# Patient Record
Sex: Male | Born: 1975 | Race: Black or African American | Hispanic: No | Marital: Married | Smoking: Current some day smoker
Health system: Southern US, Community
[De-identification: ages and names within clinical notes are randomized; demographics above are authoritative.]

## PROBLEM LIST (undated history)

## (undated) DIAGNOSIS — E669 Obesity, unspecified: Secondary | ICD-10-CM

## (undated) DIAGNOSIS — I10 Essential (primary) hypertension: Secondary | ICD-10-CM

## (undated) DIAGNOSIS — R002 Palpitations: Secondary | ICD-10-CM

## (undated) DIAGNOSIS — Z9884 Bariatric surgery status: Secondary | ICD-10-CM

## (undated) DIAGNOSIS — I4729 Other ventricular tachycardia: Secondary | ICD-10-CM

## (undated) DIAGNOSIS — E119 Type 2 diabetes mellitus without complications: Secondary | ICD-10-CM

## (undated) DIAGNOSIS — I472 Ventricular tachycardia: Secondary | ICD-10-CM

## (undated) HISTORY — DX: Type 2 diabetes mellitus without complications: E11.9

## (undated) HISTORY — DX: Essential (primary) hypertension: I10

## (undated) HISTORY — DX: Bariatric surgery status: Z98.84

## (undated) HISTORY — DX: Palpitations: R00.2

## (undated) HISTORY — DX: Other ventricular tachycardia: I47.29

## (undated) HISTORY — DX: Ventricular tachycardia: I47.2

---

## 2017-04-16 ENCOUNTER — Emergency Department (HOSPITAL_COMMUNITY)
Admission: EM | Admit: 2017-04-16 | Discharge: 2017-04-17 | Payer: Self-pay | Attending: Emergency Medicine | Admitting: Emergency Medicine

## 2017-04-16 ENCOUNTER — Encounter (HOSPITAL_COMMUNITY): Payer: Self-pay | Admitting: Emergency Medicine

## 2017-04-16 DIAGNOSIS — R000 Tachycardia, unspecified: Secondary | ICD-10-CM | POA: Insufficient documentation

## 2017-04-16 DIAGNOSIS — F29 Unspecified psychosis not due to a substance or known physiological condition: Secondary | ICD-10-CM | POA: Insufficient documentation

## 2017-04-16 LAB — COMPREHENSIVE METABOLIC PANEL
ALT: 54 U/L (ref 17–63)
AST: 37 U/L (ref 15–41)
Albumin: 4.3 g/dL (ref 3.5–5.0)
Alkaline Phosphatase: 69 U/L (ref 38–126)
Anion gap: 13 (ref 5–15)
BUN: 9 mg/dL (ref 6–20)
CO2: 22 mmol/L (ref 22–32)
Calcium: 9.5 mg/dL (ref 8.9–10.3)
Chloride: 102 mmol/L (ref 101–111)
Creatinine, Ser: 1.16 mg/dL (ref 0.61–1.24)
GFR calc Af Amer: >60 mL/min (ref >60)
GFR calc non Af Amer: >60 mL/min (ref >60)
Glucose, Bld: 193 mg/dL — ABNORMAL HIGH (ref 65–99)
Potassium: 3.5 mmol/L (ref 3.5–5.1)
Sodium: 137 mmol/L (ref 135–145)
Total Bilirubin: 0.8 mg/dL (ref 0.3–1.2)
Total Protein: 8.5 g/dL — ABNORMAL HIGH (ref 6.5–8.1)

## 2017-04-16 LAB — CBC
HCT: 45.7 % (ref 39.0–52.0)
Hemoglobin: 15.5 g/dL (ref 13.0–17.0)
MCH: 28.7 pg (ref 26.0–34.0)
MCHC: 33.9 g/dL (ref 30.0–36.0)
MCV: 84.6 fL (ref 78.0–100.0)
Platelets: 294 10*3/uL (ref 150–400)
RBC: 5.4 MIL/uL (ref 4.22–5.81)
RDW: 13.5 % (ref 11.5–15.5)
WBC: 8.7 10*3/uL (ref 4.0–10.5)

## 2017-04-16 LAB — SALICYLATE LEVEL: Salicylate Lvl: <7 mg/dL (ref 2.8–30.0)

## 2017-04-16 LAB — ETHANOL: Alcohol, Ethyl (B): <5 mg/dL (ref <5)

## 2017-04-16 LAB — ACETAMINOPHEN LEVEL: Acetaminophen (Tylenol), Serum: <10 ug/mL — ABNORMAL LOW (ref 10–30)

## 2017-04-16 MED ORDER — STERILE WATER FOR INJECTION IJ SOLN
INTRAMUSCULAR | Status: AC
  Administered 2017-04-16: 10 mL
  Filled 2017-04-16: qty 10

## 2017-04-16 MED ORDER — ZIPRASIDONE MESYLATE 20 MG IM SOLR
20.0000 mg | Freq: Once | INTRAMUSCULAR | Status: AC
  Administered 2017-04-16: 20 mg via INTRAMUSCULAR
  Filled 2017-04-16: qty 20

## 2017-04-16 NOTE — ED Notes (Addendum)
Patient spitting and being really aggressive. MD notified.

## 2017-04-16 NOTE — ED Notes (Signed)
Pt reports "I see and hear them... They're asking, 'Where is my family?'".  Pt will not further elaborate hallucinations, but kept saying, "I'm sorry".  Pt will not tell why he is sorry.

## 2017-04-16 NOTE — ED Notes (Signed)
Patient put in restraints and given Geodon. Patient had to be held by staff to apply restraints.

## 2017-04-16 NOTE — ED Notes (Signed)
Patient refused to let staff to help him. Patient refused vital signs and EKG. Patient yelling, cussing, spitting, hitting walls, and he pulled his IV out. Patient is being really aggressive.

## 2017-04-16 NOTE — ED Triage Notes (Signed)
EMS brought patient in due to patient called stating he is dying. Patient had shrooms early and has been fasting for 5 days. Ems states that patient was tachycardic. Patient is refusing us to do anything for him asking for his wife. Patient wife per EMS did not say anything to them and watch husband being taken away.

## 2017-04-16 NOTE — ED Provider Notes (Signed)
[facility]-EMERGENCY DEPT Provider Note   CSN: [id number] Arrival date & time: 04/16/17  2050   By signing my name below, I, Soijett Blue, attest that this documentation has been prepared under the direction and in the presence of Raeford RazorKohut, Jenniger Figiel, MD. Electronically Signed: Soijett Blue, ED Scribe. 04/16/17. 9:26 PM.  History   Chief Complaint Chief Complaint  Patient presents with  . Tachycardia  . Hallucinations     LEVEL 5 CAVEAT: ALTERED MENTAL STATUS  HPI Bernard Baker is a 41 y.o. male who presents to the Emergency Department BIB EMS complaining of hallucinations onset PTA. Per EMS report, pt called because he thought that he was dying. Pt informed EMS that he had shrooms prior to EMS arrival. EMS reports that they didn't give the pt any medications while en route to the ED. EMS denies the pt having any other symptoms at this time.    The history is provided by the patient and the EMS personnel. No language interpreter was used.    History reviewed. No pertinent past medical history.  There are no active problems to display for this patient.   History reviewed. No pertinent surgical history.  OB History    No data available       Home Medications    Prior to Admission medications   Not on File    Family History History reviewed. No pertinent family history.  Social History Social History  Substance Use Topics  . Smoking status: Never Smoker  . Smokeless tobacco: Never Used  . Alcohol use No     Allergies   Patient has no known allergies.   Review of Systems Review of Systems  Unable to perform ROS: Mental status change     Physical Exam Updated Vital Signs Ht 5\' 7"  (1.702 m)   Wt 250 lb (113.4 kg)   BMI 39.16 kg/m   Physical Exam  Constitutional: He appears well-developed and well-nourished. No distress.  HENT:  Head: Normocephalic and atraumatic.  Eyes: EOM are normal.  Neck: Neck supple.  Cardiovascular: Regular rhythm and normal  heart sounds.  Tachycardia present.  Exam reveals no gallop and no friction rub.   No murmur heard. Pulmonary/Chest: Effort normal and breath sounds normal. No respiratory distress. She has no wheezes. She has no rales.  Abdominal: She exhibits no distension.  Musculoskeletal: Normal range of motion.  Neurological: He is alert.  Moves all extremities. No facial droop. Pupils ~23mm b/l. Not following commands. Muscle tone seems normal.  Skin: Skin is warm and dry.  No external signs of trauma.  Psychiatric:  Pt is acting very bizarrely. Appears to be responding to internal stimuli. Laughing inappropriately. Making hissing noises. Forcefully blinking.   Nursing note and vitals reviewed.    ED Treatments / Results   COORDINATION OF CARE: 9:35 PM Discussed treatment plan at bedside   Labs (all labs ordered are listed, but only abnormal results are displayed) Labs Reviewed  COMPREHENSIVE METABOLIC PANEL - Abnormal; Notable for the following:       Result Value   Glucose, Bld 193 (*)    Total Protein 8.5 (*)    All other components within normal limits  ACETAMINOPHEN LEVEL - Abnormal; Notable for the following:    Acetaminophen (Tylenol), Serum <10 (*)    All other components within normal limits  ETHANOL  SALICYLATE LEVEL  CBC  RAPID URINE DRUG SCREEN, HOSP PERFORMED    EKG  EKG Interpretation None       Radiology No  results found.  Procedures Procedures (including critical care time)  Medications Ordered in ED Medications - No data to display   Initial Impression / Assessment and Plan / ED Course  I have reviewed the triage vital signs and the nursing notes.  Pertinent labs & imaging results that were available during my care of the patient were reviewed by me and considered in my medical decision making (see chart for details).     40yM with bizzare behavior. Most likley drug induced. Difficulty in obtaining needed testing and pulling of monitoring equipment.  Will chemically restrain. Work-up. Supportive care and observation. Needs reassessed in morning. Psychiatric evaluation if continues with this behavior.   Final Clinical Impressions(s) / ED Diagnoses   Final diagnoses:  Psychosis, unspecified psychosis type    New Prescriptions New Prescriptions   No medications on file   I personally preformed the services scribed in my presence. The recorded information has been reviewed is accurate. Raeford Razor, MD.     Raeford Razor, MD 04/27/17 [phone]

## 2017-04-17 LAB — RAPID URINE DRUG SCREEN, HOSP PERFORMED
Amphetamines: NOT DETECTED
Barbiturates: NOT DETECTED
Benzodiazepines: NOT DETECTED
Cocaine: NOT DETECTED
Opiates: NOT DETECTED
Tetrahydrocannabinol: NOT DETECTED

## 2017-04-17 NOTE — ED Notes (Signed)
Patient is discharged waiting on [employer].

## 2017-06-10 ENCOUNTER — Encounter (HOSPITAL_COMMUNITY): Payer: Self-pay | Admitting: *Deleted

## 2017-06-10 ENCOUNTER — Emergency Department (HOSPITAL_COMMUNITY): Payer: Self-pay

## 2017-06-10 ENCOUNTER — Emergency Department (HOSPITAL_COMMUNITY)
Admission: EM | Admit: 2017-06-10 | Discharge: 2017-06-10 | Disposition: A | Discharge status: Home / Self Care | Payer: Self-pay | Attending: Emergency Medicine | Admitting: Emergency Medicine

## 2017-06-10 DIAGNOSIS — I10 Essential (primary) hypertension: Secondary | ICD-10-CM | POA: Insufficient documentation

## 2017-06-10 DIAGNOSIS — R002 Palpitations: Secondary | ICD-10-CM | POA: Insufficient documentation

## 2017-06-10 DIAGNOSIS — R079 Chest pain, unspecified: Secondary | ICD-10-CM | POA: Insufficient documentation

## 2017-06-10 HISTORY — DX: Essential (primary) hypertension: I10

## 2017-06-10 HISTORY — DX: Obesity, unspecified: E66.9

## 2017-06-10 LAB — BASIC METABOLIC PANEL
Anion gap: 14 (ref 5–15)
BUN: 7 mg/dL (ref 6–20)
CALCIUM: 9.8 mg/dL (ref 8.9–10.3)
CO2: 20 mmol/L — ABNORMAL LOW (ref 22–32)
Chloride: 104 mmol/L (ref 101–111)
Creatinine, Ser: 0.95 mg/dL (ref 0.61–1.24)
GFR calc Af Amer: >60 mL/min (ref >60)
GLUCOSE: 124 mg/dL — AB (ref 65–99)
Potassium: 3.5 mmol/L (ref 3.5–5.1)
Sodium: 138 mmol/L (ref 135–145)

## 2017-06-10 LAB — CBC
HEMATOCRIT: 46.1 % (ref 39.0–52.0)
Hemoglobin: 15.2 g/dL (ref 13.0–17.0)
MCH: 28.1 pg (ref 26.0–34.0)
MCHC: 33 g/dL (ref 30.0–36.0)
MCV: 85.2 fL (ref 78.0–100.0)
Platelets: 309 10*3/uL (ref 150–400)
RBC: 5.41 MIL/uL (ref 4.22–5.81)
RDW: 13.3 % (ref 11.5–15.5)
WBC: 5.5 10*3/uL (ref 4.0–10.5)

## 2017-06-10 LAB — I-STAT TROPONIN, ED: TROPONIN I, POC: 0 ng/mL (ref 0.00–0.08)

## 2017-06-10 NOTE — ED Notes (Signed)
Patient Alert and oriented X4. Stable and ambulatory. Patient verbalized understanding of the discharge instructions.  Patient belongings were taken by the patient.  

## 2017-06-10 NOTE — ED Triage Notes (Signed)
Pt reports mid to left side chest pain for several days. Is pain free at this time but started having palpitations last night. ekg done at triage and no acute distress is noted.

## 2017-06-10 NOTE — ED Provider Notes (Signed)
[facility]-EMERGENCY DEPT Provider Note   CSN: [id number] Arrival date & time: 06/10/17  1232     History   Chief Complaint Chief Complaint  Patient presents with  . Chest Pain    HPI Bernard Baker is a 41 y.o. male.  This is a 41 year old male with PMH of HTN who presents with intermittent episodes of palpitations and chest pain which began yesterday evening.  The patient states he was sitting on his couch watching TV when they occurred.  He states he feels like his heart "skips a beat".  He denies any other symptoms and he states he is asymptomatic at this time.  He denies nausea, vomiting, blurry vision, numbness and tingling his extremities, decreased sensation, abdominal pain, dyspnea.  Denies dyspnea occurring when the sensations occur.  He denies exertional dyspnea, syncope.  Denies any history of sudden cardiac death in his family, denies history of cardiac disease less than age 34 in primary relatives.   The history is provided by the patient.    Past Medical History:  Diagnosis Date  . Hypertension   . Obesity     There are no active problems to display for this patient.   History reviewed. No pertinent surgical history.     Home Medications    Prior to Admission medications   Not on File    Family History History reviewed. No pertinent family history.  Social History Social History  Substance Use Topics  . Smoking status: Never Smoker  . Smokeless tobacco: Never Used  . Alcohol use No     Allergies   Shellfish allergy   Review of Systems Review of Systems  Constitutional: Negative for appetite change, chills, diaphoresis, fatigue, fever and unexpected weight change.  HENT: Negative for ear pain and sore throat.   Eyes: Negative for pain and visual disturbance.  Respiratory: Negative for cough, chest tightness, shortness of breath and wheezing.   Cardiovascular: Positive for chest pain. Negative for palpitations and leg swelling.    Gastrointestinal: Negative for abdominal pain, blood in stool and vomiting.  Genitourinary: Negative for dysuria and hematuria.  Musculoskeletal: Positive for back pain. Negative for arthralgias.  Skin: Negative for color change and rash.  Neurological: Negative for dizziness, tremors, seizures, syncope, facial asymmetry, speech difficulty, weakness, light-headedness, numbness and headaches.  All other systems reviewed and are negative.    Physical Exam Updated Vital Signs BP 117/76   Pulse 91   Temp 98.4 F (36.9 C) (Oral)   Resp 20   SpO2 98%   Physical Exam  Constitutional: He is oriented to person, place, and time. He appears well-developed and well-nourished. No distress.  HENT:  Head: Normocephalic and atraumatic.  Eyes: Pupils are equal, round, and reactive to light. Conjunctivae are normal.  Neck: Normal range of motion. Neck supple.  Cardiovascular: Normal rate, regular rhythm, normal heart sounds and intact distal pulses.   No extrasystoles are present.  No murmur heard. No discrepancy noted in bilateral UE pulses or UE and LE pulses.   Pulmonary/Chest: Effort normal and breath sounds normal. No respiratory distress.  Abdominal: Soft. There is no tenderness.  Musculoskeletal: Normal range of motion. He exhibits no edema.  Neurological: He is alert and oriented to person, place, and time. He has normal strength. He displays no tremor. No cranial nerve deficit or sensory deficit. Coordination and gait normal.  Skin: Skin is warm and dry. Capillary refill takes less than 2 seconds. No rash noted. He is not diaphoretic. No erythema. No  pallor.  Psychiatric: He has a normal mood and affect.  Nursing note and vitals reviewed.    ED Treatments / Results  Labs (all labs ordered are listed, but only abnormal results are displayed) Labs Reviewed  BASIC METABOLIC PANEL - Abnormal; Notable for the following:       Result Value   CO2 20 (*)    Glucose, Bld 124 (*)    All  other components within normal limits  CBC  I-STAT TROPONIN, ED    EKG  EKG Interpretation  Date/Time:  Friday June 10 2017 12:35:39 EDT Ventricular Rate:  74 PR Interval:  140 QRS Duration: 82 QT Interval:  368 QTC Calculation: 408 R Axis:   4 Text Interpretation:  Normal sinus rhythm Normal ECG No previous ECGs available Confirmed by Alvira Monday ([id number]) on 06/10/2017 2:57:24 PM       Radiology Dg Chest 2 View  Result Date: 06/10/2017 CLINICAL DATA:  Left-sided chest pain 2 days with abnormal heartbeat and left arm tingling. Slight shortness of breath. EXAM: CHEST  2 VIEW COMPARISON:  None. FINDINGS: The heart size and mediastinal contours are within normal limits. Both lungs are clear. The visualized skeletal structures are unremarkable. IMPRESSION: No active cardiopulmonary disease. Electronically Signed   By: Elberta Fortis M.D.   On: 06/10/2017 13:07    Procedures Procedures (including critical care time)  Medications Ordered in ED Medications - No data to display   Initial Impression / Assessment and Plan / ED Course  I have reviewed the triage vital signs and the nursing notes.  Pertinent labs & imaging results that were available during my care of the patient were reviewed by me and considered in my medical decision making (see chart for details).     This is a 41 year old male with PMH of HTN who presents with intermittent episodes of palpitations and chest pain which began yesterday evening.   ECG is normal sinus rhythm and rate, without evidence of ST or T wave changes of myocardial ischemia.    No EKG findings of HOCM, WPW, Brugada, pre-excitation or prolonged QT. No tachycardia, or right ventricular heart strain suggestive of PE.    Because of the age and risk factors of the patient, ACS will be ruled out with troponin x 1 given lack of chest pain which initially began 24 hours prior. ASA was not given. Chest XR and EKG performed along with CBC and  BMP.  HEART score calculation: 1   Unlikely PNA as CXR shows no acute cardiopulmonary findings, no leukocytosis, no cough, no fever.  Unlikely PE as atypical presentation, patient is PERC negative.  Doubt Aortic Dissection given physical findings as above. Patient has back pain but it is left sided, no active chest pain, chest pain did not occur with back pain, and distal pulses intact with no neurological findings. Patient also is well appearing with a reassuring clinical presentation. Doubt Pancreatitis, Arrhythmia, Pneumothorax, Endo/Myo/Pericarditis, ulcer perforation or other emergent pathology.  Labs and imaging reviewed by myself and considered in medical decision making.  Imaging interpreted by radiology.   At this time, given age and lack of risk factors, I believe chest pain to be benign cause. Patient will be discharged home is follow up with PCP. Patient in agreement with plan.   Return precautions given. All questions answered.  Final Clinical Impressions(s) / ED Diagnoses   Final diagnoses:  Palpitations  Chest pain, unspecified type    New Prescriptions New Prescriptions   No medications on  file     Shaune Pollack, MD 06/10/17 1527    Alvira Monday, MD 06/12/17 1410

## 2017-12-14 ENCOUNTER — Encounter: Payer: Self-pay | Admitting: Cardiology

## 2018-01-10 ENCOUNTER — Encounter: Payer: Self-pay | Admitting: Cardiology

## 2018-01-10 ENCOUNTER — Ambulatory Visit (INDEPENDENT_AMBULATORY_CARE_PROVIDER_SITE_OTHER): Payer: Self-pay | Admitting: Cardiology

## 2018-01-10 ENCOUNTER — Telehealth: Payer: Self-pay | Admitting: *Deleted

## 2018-01-10 VITALS — BP 110/68 | HR 77 | Ht 67.0 in | Wt 259.6 lb

## 2018-01-10 DIAGNOSIS — I10 Essential (primary) hypertension: Secondary | ICD-10-CM | POA: Insufficient documentation

## 2018-01-10 DIAGNOSIS — Z8679 Personal history of other diseases of the circulatory system: Secondary | ICD-10-CM

## 2018-01-10 DIAGNOSIS — E119 Type 2 diabetes mellitus without complications: Secondary | ICD-10-CM

## 2018-01-10 DIAGNOSIS — G4730 Sleep apnea, unspecified: Secondary | ICD-10-CM | POA: Insufficient documentation

## 2018-01-10 DIAGNOSIS — R002 Palpitations: Secondary | ICD-10-CM

## 2018-01-10 DIAGNOSIS — G4719 Other hypersomnia: Secondary | ICD-10-CM

## 2018-01-10 HISTORY — DX: Morbid (severe) obesity due to excess calories: E66.01

## 2018-01-10 HISTORY — DX: Type 2 diabetes mellitus without complications: E11.9

## 2018-01-10 HISTORY — DX: Sleep apnea, unspecified: G47.30

## 2018-01-10 HISTORY — DX: Personal history of other diseases of the circulatory system: Z86.79

## 2018-01-10 NOTE — Patient Instructions (Addendum)
Medication Instructions:  Your physician recommends that you continue on your current medications as directed. Please refer to the Current Medication list given to you today.  Labwork: None   Testing/Procedures: Your physician has requested that you have an echocardiogram. Echocardiography is a painless test that uses sound waves to create images of your heart. It provides your doctor with information about the size and shape of your heart and how well your heart's chambers and valves are working. This procedure takes approximately one hour. There are no restrictions for this procedure. [address]  Your physician has recommended that you wear an 7 DAY event monitor. Event monitors are medical devices that record the heart's electrical activity. Doctors most often us these monitors to diagnose arrhythmias. Arrhythmias are problems with the speed or rhythm of the heartbeat. The monitor is a small, portable device. You can wear one while you do your normal daily activities. This is usually used to diagnose what is causing palpitations/syncope (passing out). [address]  Your physician has recommended that you have a sleep study. This test records several body functions during sleep, including: brain activity, eye movement, oxygen and carbon dioxide blood levels, heart rate and rhythm, breathing rate and rhythm, the flow of air through your mouth and nose, snoring, body muscle movements, and chest and belly movement. YOU WILL RECEIVE A CALL TO SET UP SLEEP STUDY AFTER TEST HAS BEEN APPROVED BY YOUR INSURANCE.  Follow-Up: Your physician recommends that you schedule a follow-up appointment in: AFTER YOUR SLEEP STUDY; SOMEONE FROM OUR OFFICE WILL CALL YOU TO SET UP AN APPOINTMENT   Any Other Special Instructions Will Be Listed Below (If Applicable).  If you need a refill on your cardiac medications before your next appointment, please call your pharmacy.

## 2018-01-10 NOTE — Assessment & Plan Note (Signed)
I could not appreciate a murmur on exam today but with a history of HTN and new onset palpitations will get an echo.

## 2018-01-10 NOTE — Assessment & Plan Note (Signed)
No retinopathy or neuropathy by history

## 2018-01-10 NOTE — Assessment & Plan Note (Signed)
I suspect he is having PVCs-PACs, possibly exacerbated by sleep apnea

## 2018-01-10 NOTE — Progress Notes (Signed)
01/10/2018 [place]   Nov 22, 1975  [id number]  Primary Physician [facility] Primary Cardiologist: Dr SwazilandJordan (new)  HPI:  Pleasant 42 y/o obese AA male, works as a Paediatric nursebarber, with a history of HTN and NIDDM, seen today for evaluation of complaints of nighttime palpitations and being told recently he had a heart murmur. The pt denies tachycardia but says his heart beats "hard and irregular" at night. He has noticed daytime fatigue, not relieved with napping. He has been told he snores and he admits he doesn't sleep well, frequent awakenings. He was seen by his PCP recently and told he had a "heart murmur". He became concerned and set up this appointment for cardiac evaluation. He denies any exertional symptoms, he rides an exercise bike 20 minutes daily with no problem.    Current Outpatient Medications  Medication Sig Dispense Refill  . diclofenac (VOLTAREN) 75 MG EC tablet TK 1 T PO  BID  3  . hydrochlorothiazide (HYDRODIURIL) 25 MG tablet TK 1 T PO  QD  3  . lisinopril (PRINIVIL,ZESTRIL) 20 MG tablet TK 1 T PO  BID  3  . lovastatin (MEVACOR) 10 MG tablet TK 1 T PO  QHS  3  . metFORMIN (GLUCOPHAGE) 1000 MG tablet TK 1 T PO  BID  3   No current facility-administered medications for this visit.     Allergies  Allergen Reactions  . Shellfish Allergy Anaphylaxis    Past Medical History:  Diagnosis Date  . Diabetes mellitus without complication (HCC)   . Hypertension   . Obesity     Social History   Socioeconomic History  . Marital status: Married    Spouse name: Not on file  . Number of children: Not on file  . Years of education: Not on file  . Highest education level: Not on file  Occupational History  . Not on file  Social Needs  . Financial resource strain: Not on file  . Food insecurity:    Worry: Not on file    Inability: Not on file  . Transportation needs:    Medical: Not on file    Non-medical: Not on file  Tobacco Use  . Smoking  status: Former Smoker    Types: Cigarettes    Last attempt to quit: 2015    Years since quitting: 4.2  . Smokeless tobacco: Never Used  Substance and Sexual Activity  . Alcohol use: No  . Drug use: No  . Sexual activity: Not on file  Lifestyle  . Physical activity:    Days per week: Not on file    Minutes per session: Not on file  . Stress: Not on file  Relationships  . Social connections:    Talks on phone: Not on file    Gets together: Not on file    Attends religious service: Not on file    Active member of club or organization: Not on file    Attends meetings of clubs or organizations: Not on file    Relationship status: Not on file  . Intimate partner violence:    Fear of current or ex partner: Not on file    Emotionally abused: Not on file    Physically abused: Not on file    Forced sexual activity: Not on file  Other Topics Concern  . Not on file  Social History Narrative  . Not on file     Family History  Problem Relation Age of Onset  .  Diabetes Mother   . Heart Problems Mother   . Stroke Father   . Heart Problems Father   . Heart Problems Maternal Grandmother   . Diabetes Maternal Grandmother   . Hyperlipidemia Maternal Grandmother   . Hypertension Maternal Grandmother   . Alzheimer's disease Maternal Grandfather      Review of Systems: General: negative for chills, fever, night sweats or weight changes.  Cardiovascular: negative for chest pain, dyspnea on exertion, edema, orthopnea, paroxysmal nocturnal dyspnea or shortness of breath Dermatological: negative for rash Respiratory: negative for cough or wheezing Urologic: negative for hematuria Abdominal: negative for nausea, vomiting, diarrhea, bright red blood per rectum, melena, or hematemesis Neurologic: negative for visual changes, syncope, or dizziness Snoring, daytime fatigue, poor sleep All other systems reviewed and are otherwise negative except as noted above.    Blood pressure 110/68,  pulse 77, height 5\' 7"  (1.702 m), weight 259 lb 9.6 oz (117.8 kg).  General appearance: alert, cooperative, no distress and morbidly obese Neck: no carotid bruit and no JVD Lungs: clear to auscultation bilaterally Heart: regular rate and rhythm and i could not appreciate a heart murmur today Abdomen: obese non tender Extremities: extremities normal, atraumatic, no cyanosis or edema Pulses: 2+ and symmetric Skin: Skin color, texture, turgor normal. No rashes or lesions Neurologic: Grossly normal  EKG NSR  ASSESSMENT AND PLAN:   Palpitations I suspect he is having PVCs-PACs, possibly exacerbated by sleep apnea  Essential hypertension Controlled  Morbid obesity (HCC) BMI 40.6.   Sleep apnea Pt has poor sleep, daytime fatigue, HTN, morbid obesity, and a history of snoring-r/o sleep apnea  Non-insulin dependent type 2 diabetes mellitus (HCC) No retinopathy or neuropathy by history  History of cardiac murmur I could not appreciate a murmur on exam today but with a history of HTN and new onset palpitations will get an echo.    PLAN  Discussed with Dr Swaziland- will obtain an echo, 7 day Monitor, and sleep study. F/U 4 weeks with Dr Swaziland.   Corine Shelter PA-C 01/10/2018 10:25 AM

## 2018-01-10 NOTE — Assessment & Plan Note (Signed)
BMI 40.6 

## 2018-01-10 NOTE — Telephone Encounter (Signed)
-----   Message from Sampson Goon[place]awnee I Trigloff sent at 01/10/2018 10:31 AM EDT ----- Regarding: sleep study   Sleep study

## 2018-01-10 NOTE — Assessment & Plan Note (Signed)
Pt has poor sleep, daytime fatigue, HTN, morbid obesity, and a history of snoring-r/o sleep apnea

## 2018-01-10 NOTE — Assessment & Plan Note (Signed)
Controlled.  

## 2018-01-10 NOTE — Telephone Encounter (Signed)
Patient informed of 01/31/18 sleep study appointment.

## 2018-01-18 ENCOUNTER — Ambulatory Visit (HOSPITAL_COMMUNITY): Payer: Self-pay | Attending: Cardiovascular Disease

## 2018-01-18 DIAGNOSIS — R0989 Other specified symptoms and signs involving the circulatory and respiratory systems: Secondary | ICD-10-CM

## 2018-01-30 ENCOUNTER — Ambulatory Visit (INDEPENDENT_AMBULATORY_CARE_PROVIDER_SITE_OTHER): Payer: Self-pay

## 2018-01-30 DIAGNOSIS — I10 Essential (primary) hypertension: Secondary | ICD-10-CM

## 2018-01-30 DIAGNOSIS — Z8679 Personal history of other diseases of the circulatory system: Secondary | ICD-10-CM

## 2018-01-30 DIAGNOSIS — E119 Type 2 diabetes mellitus without complications: Secondary | ICD-10-CM

## 2018-01-30 DIAGNOSIS — R002 Palpitations: Secondary | ICD-10-CM

## 2018-01-30 DIAGNOSIS — G4730 Sleep apnea, unspecified: Secondary | ICD-10-CM

## 2018-01-31 ENCOUNTER — Ambulatory Visit (HOSPITAL_BASED_OUTPATIENT_CLINIC_OR_DEPARTMENT_OTHER): Payer: Self-pay

## 2018-02-07 ENCOUNTER — Ambulatory Visit (HOSPITAL_COMMUNITY): Payer: Self-pay | Attending: Internal Medicine

## 2018-02-07 ENCOUNTER — Other Ambulatory Visit: Payer: Self-pay

## 2018-02-07 DIAGNOSIS — E669 Obesity, unspecified: Secondary | ICD-10-CM | POA: Insufficient documentation

## 2018-02-07 DIAGNOSIS — Z6841 Body Mass Index (BMI) 40.0 and over, adult: Secondary | ICD-10-CM | POA: Insufficient documentation

## 2018-02-07 DIAGNOSIS — I119 Hypertensive heart disease without heart failure: Secondary | ICD-10-CM | POA: Insufficient documentation

## 2018-02-07 DIAGNOSIS — E119 Type 2 diabetes mellitus without complications: Secondary | ICD-10-CM | POA: Insufficient documentation

## 2018-02-07 DIAGNOSIS — R002 Palpitations: Secondary | ICD-10-CM | POA: Insufficient documentation

## 2018-02-07 DIAGNOSIS — Z8679 Personal history of other diseases of the circulatory system: Secondary | ICD-10-CM

## 2018-02-07 DIAGNOSIS — G4730 Sleep apnea, unspecified: Secondary | ICD-10-CM

## 2018-02-07 DIAGNOSIS — I10 Essential (primary) hypertension: Secondary | ICD-10-CM

## 2018-02-21 ENCOUNTER — Ambulatory Visit (HOSPITAL_BASED_OUTPATIENT_CLINIC_OR_DEPARTMENT_OTHER): Payer: Self-pay | Attending: Cardiology | Admitting: Cardiovascular Disease

## 2018-02-21 VITALS — Ht 67.0 in | Wt 259.0 lb

## 2018-02-21 DIAGNOSIS — R002 Palpitations: Secondary | ICD-10-CM | POA: Insufficient documentation

## 2018-02-21 DIAGNOSIS — Z6841 Body Mass Index (BMI) 40.0 and over, adult: Secondary | ICD-10-CM | POA: Insufficient documentation

## 2018-02-21 DIAGNOSIS — G4761 Periodic limb movement disorder: Secondary | ICD-10-CM | POA: Insufficient documentation

## 2018-02-21 DIAGNOSIS — I10 Essential (primary) hypertension: Secondary | ICD-10-CM | POA: Insufficient documentation

## 2018-02-21 DIAGNOSIS — G4719 Other hypersomnia: Secondary | ICD-10-CM | POA: Insufficient documentation

## 2018-02-21 DIAGNOSIS — R0683 Snoring: Secondary | ICD-10-CM

## 2018-02-21 DIAGNOSIS — E119 Type 2 diabetes mellitus without complications: Secondary | ICD-10-CM | POA: Insufficient documentation

## 2018-02-21 DIAGNOSIS — G4736 Sleep related hypoventilation in conditions classified elsewhere: Secondary | ICD-10-CM | POA: Insufficient documentation

## 2018-02-25 ENCOUNTER — Encounter (HOSPITAL_BASED_OUTPATIENT_CLINIC_OR_DEPARTMENT_OTHER): Payer: Self-pay | Admitting: Cardiovascular Disease

## 2018-02-25 NOTE — Procedures (Signed)
Patient Name: Bernard Baker, [name] Study Date: 02/21/2018 Gender: Male D.O.B: Sep 11, 1976 Age (years): 41 Referring Provider: Corine ShelterLuke Kilroy Height (inches): 67 Interpreting Physician: Nicki Guadalajarahomas Jaclin Finks MD, ABSM Weight (lbs): 255 RPSGT: Lowry RamMckinney, Takeya BMI: 40 MRN: [id number] Neck Size: 17.00  CLINICAL INFORMATION Sleep Study Type: NPSG  Indication for sleep study: Diabetes, Excessive Daytime Sleepiness, Hypertension, Obesity, OSA, Snoring  Epworth Sleepiness Score: 17  SLEEP STUDY TECHNIQUE As per the AASM Manual for the Scoring of Sleep and Associated Events v2.3 (April 2016) with a hypopnea requiring 4% desaturations.  The channels recorded and monitored were frontal, central and occipital EEG, electrooculogram (EOG), submentalis EMG (chin), nasal and oral airflow, thoracic and abdominal wall motion, anterior tibialis EMG, snore microphone, electrocardiogram, and pulse oximetry.  MEDICATIONS     diclofenac (VOLTAREN) 75 MG EC tablet             hydrochlorothiazide (HYDRODIURIL) 25 MG tablet         lisinopril (PRINIVIL,ZESTRIL) 20 MG tablet         lovastatin (MEVACOR) 10 MG tablet         metFORMIN (GLUCOPHAGE) 1000 MG tablet      Medications self-administered by patient taken the night of the study : LOVASTATIN  SLEEP ARCHITECTURE The study was initiated at 9:50:13 PM and ended at 3:59:31 AM.  Sleep onset time was 13.8 minutes and the sleep efficiency was 86.4%%. The total sleep time was 319 minutes.  Stage REM latency was 97.0 minutes.  The patient spent 4.4%% of the night in stage N1 sleep, 80.6%% in stage N2 sleep, 1.9%% in stage N3 and 13.17% in REM.  Alpha intrusion was absent.  Supine sleep was 30.08%.  RESPIRATORY PARAMETERS The overall apnea/hypopnea index (AHI) was 0.4 per hour. The respiratory disturbance index (RDI) was 0.9 per hour. There were 1 total apneas, including 0 obstructive, 1 central and 0 mixed apneas. There were 1 hypopneas and 3  RERAs.  The AHI during Stage REM sleep was 2.9 per hour.  AHI while supine was 0.0 per hour.  The mean oxygen saturation was 93.2%. The minimum SpO2 during sleep was 88.0%.  moderate snoring was noted during this study.  CARDIAC DATA The 2 lead EKG demonstrated sinus rhythm. The mean heart rate was 68.8 beats per minute. Other EKG findings include: None.  LEG MOVEMENT DATA The total PLMS were 61 with a resulting PLMS index of 11.1. Associated arousal with leg movement index was 5.3 .  IMPRESSIONS - No significant obstructive sleep apnea occurred during this study (AHI 0.4/h; RDI 0.9/h; REM AHI 2.9/h). - No significant central sleep apnea occurred during this study (CAI = 0.2/h). - Minimal oxygen desaturation to a nadir of 88%. - The patient snored with moderate snoring volume. - No cardiac abnormalities were noted during this study. - Clinically significant periodic limb movements did not occur during sleep. Associated arousals were significant.  DIAGNOSIS - Periodic Limb Movement During Sleep (327.51 [G47.61 ICD-10]) - Excesive Daytime Sleepiness - Nocturnal Hypoxemia (327.26 [G47.36 ICD-10])  RECOMMENDATIONS - At present there is no indication for CPAP therapy.  - Effort should be made to optimize nasal and oropharyngeal patency. Consider alternatives for the treatment of moderate snoring.  - If patient conitues to have excessive daytime sleepiness with an ESS score of 17, consider scheduling a MLST (Multiple latency sleep test) to evaluate for idiopathic hypersomnolence or narcolepsy. - If patient is symptomatic with restless legs, consider a trial of pharmocotherapy.  - Avoid alcohol, sedatives and other  CNS depressants that may worsen sleep apnea and disrupt normal sleep architecture. - Sleep hygiene should be reviewed to assess factors that may improve sleep quality and duration. - Weight management (BMI 40) and regular exercise should be initiated.  [Electronically  signed] 02/25/2018 04:15 PM  Nicki Guadalajara MD, [facility], ABSM Diplomate, American Board of Sleep Medicine   NPI: [id number] [facility] PH: [phone]   FX: [phone] ACCREDITED BY THE AMERICAN ACADEMY OF SLEEP MEDICINE

## 2018-02-28 ENCOUNTER — Telehealth: Payer: Self-pay | Admitting: *Deleted

## 2018-02-28 NOTE — Telephone Encounter (Signed)
-----   Message from Lennette Biharihomas A Kelly, MD sent at 02/25/2018  4:22 PM EDT ----- Bernard MortimerWanda please notify pt the resuots of study.  With significant ESS of 17 consider scheduling pt for an MLST if she continues to have daytime sleepiness

## 2018-02-28 NOTE — Progress Notes (Signed)
Patient notified of results.

## 2018-02-28 NOTE — Telephone Encounter (Signed)
Patient informed of sleep study results and recommendations. He wants to defer the MLST for now.

## 2018-04-07 ENCOUNTER — Encounter: Payer: Self-pay | Admitting: Cardiology

## 2020-06-23 HISTORY — PX: LAPAROSCOPIC GASTRIC SLEEVE RESECTION: SHX5895

## 2020-08-11 ENCOUNTER — Emergency Department (HOSPITAL_COMMUNITY)
Admission: EM | Admit: 2020-08-11 | Discharge: 2020-08-11 | Disposition: A | Discharge status: Home / Self Care | Payer: Self-pay | Attending: Emergency Medicine | Admitting: Emergency Medicine

## 2020-08-11 ENCOUNTER — Encounter (HOSPITAL_COMMUNITY): Payer: Self-pay | Admitting: Emergency Medicine

## 2020-08-11 DIAGNOSIS — Z79899 Other long term (current) drug therapy: Secondary | ICD-10-CM | POA: Insufficient documentation

## 2020-08-11 DIAGNOSIS — Z87891 Personal history of nicotine dependence: Secondary | ICD-10-CM | POA: Insufficient documentation

## 2020-08-11 DIAGNOSIS — M549 Dorsalgia, unspecified: Secondary | ICD-10-CM

## 2020-08-11 DIAGNOSIS — I10 Essential (primary) hypertension: Secondary | ICD-10-CM | POA: Insufficient documentation

## 2020-08-11 DIAGNOSIS — E119 Type 2 diabetes mellitus without complications: Secondary | ICD-10-CM | POA: Insufficient documentation

## 2020-08-11 DIAGNOSIS — Z7984 Long term (current) use of oral hypoglycemic drugs: Secondary | ICD-10-CM | POA: Insufficient documentation

## 2020-08-11 DIAGNOSIS — M545 Low back pain, unspecified: Secondary | ICD-10-CM | POA: Insufficient documentation

## 2020-08-11 LAB — BASIC METABOLIC PANEL
Anion gap: 11 (ref 5–15)
BUN: 5 mg/dL — ABNORMAL LOW (ref 6–20)
CO2: 24 mmol/L (ref 22–32)
Calcium: 9.1 mg/dL (ref 8.9–10.3)
Chloride: 105 mmol/L (ref 98–111)
Creatinine, Ser: 0.9 mg/dL (ref 0.61–1.24)
GFR, Estimated: >60 mL/min (ref >60)
Glucose, Bld: 71 mg/dL (ref 70–99)
Potassium: 3.9 mmol/L (ref 3.5–5.1)
Sodium: 140 mmol/L (ref 135–145)

## 2020-08-11 LAB — URINALYSIS, ROUTINE W REFLEX MICROSCOPIC
Glucose, UA: NEGATIVE mg/dL
Hgb urine dipstick: NEGATIVE
Ketones, ur: 80 mg/dL — AB
Leukocytes,Ua: NEGATIVE
Nitrite: NEGATIVE
Protein, ur: 100 mg/dL — AB
Specific Gravity, Urine: 1.03 (ref 1.005–1.030)
pH: 5 (ref 5.0–8.0)

## 2020-08-11 LAB — CBC
HCT: 45.2 % (ref 39.0–52.0)
Hemoglobin: 14 g/dL (ref 13.0–17.0)
MCH: 27.8 pg (ref 26.0–34.0)
MCHC: 31 g/dL (ref 30.0–36.0)
MCV: 89.9 fL (ref 80.0–100.0)
Platelets: 260 10*3/uL (ref 150–400)
RBC: 5.03 MIL/uL (ref 4.22–5.81)
RDW: 14 % (ref 11.5–15.5)
WBC: 4.3 10*3/uL (ref 4.0–10.5)
nRBC: 0 % (ref 0.0–0.2)

## 2020-08-11 MED ORDER — LIDOCAINE 5 % EX PTCH: 1.0000 | MEDICATED_PATCH | CUTANEOUS | 0 refills | Status: DC

## 2020-08-11 MED ORDER — CYCLOBENZAPRINE HCL 10 MG PO TABS: 10.0000 mg | ORAL_TABLET | Freq: Two times a day (BID) | ORAL | 0 refills | Status: DC | PRN

## 2020-08-11 NOTE — Discharge Instructions (Addendum)
Continue to take Tylenol ibuprofen for the back pain.  Try adding on the Flexeril and the Lidoderm patch.  The Lidoderm may be expensive depending on your insurance plan.  If so, you can use over-the-counter lidocaine patches as an alternative.  Brand names include salonpas.  Follow up with your primary care doctor of chiropractor for further treatment.

## 2020-08-11 NOTE — ED Provider Notes (Signed)
[facility] EMERGENCY DEPARTMENT Provider Note   CSN: [id number] Arrival date & time: 08/11/20  0740     History Chief Complaint  Patient presents with   Back Pain    Bernard Baker is a 44 y.o. male.  HPI   Patient presents ED for evaluation of back pain.  Patient states the symptoms started several months ago.  Pain is in his left side of his lower back.  Originally when the pain was starting patient had been seeing a [facility].  Patient also ended up getting gastric bypass surgery.  He was out of work for period of time while recovering from the gastric bypass surgery.  His symptoms got better.  Patient started working again and since then he feels like the pain has been increasing.  He stands a lot during the day.  He works as a Paediatric nurse.  He denies any numbness or weakness.  He denies any trouble with abdominal pain.  No vomiting.  He feels like he is urinating normally.  Patient did have an MRI in the [place].  He states his chiropractor reviewed the MRI but did not see anything acutely abnormal.  Patient is concerned that may be it something more with his kidney because the pain is off towards the left side.  He has been taking medications for pain but the pain persists.  He came to the ED because of his persistent discomfort  Past Medical History:  Diagnosis Date   Diabetes mellitus without complication (HCC)    Hypertension    Obesity     Patient Active Problem List   Diagnosis Date Noted   Palpitations 01/10/2018   History of cardiac murmur 01/10/2018   Essential hypertension 01/10/2018   Non-insulin dependent type 2 diabetes mellitus (HCC) 01/10/2018   Morbid obesity (HCC) 01/10/2018   Sleep apnea 01/10/2018    History reviewed. No pertinent surgical history.     Family History  Problem Relation Age of Onset   Diabetes Mother    Heart Problems Mother    Stroke Father    Heart Problems Father    Heart Problems  Maternal Grandmother    Diabetes Maternal Grandmother    Hyperlipidemia Maternal Grandmother    Hypertension Maternal Grandmother    Alzheimer's disease Maternal Grandfather    Blindness Son    Blindness Daughter    Autism Daughter     Social History   Tobacco Use   Smoking status: Former Smoker    Types: Cigarettes    Quit date: 2015    Years since quitting: 6.8   Smokeless tobacco: Never Used  Building services engineer Use: Former  Substance Use Topics   Alcohol use: No   Drug use: No    Home Medications Prior to Admission medications   Medication Sig Start Date End Date Taking? Authorizing Provider  cyclobenzaprine (FLEXERIL) 10 MG tablet Take 1 tablet (10 mg total) by mouth 2 (two) times daily as needed for muscle spasms. 08/11/20   Linwood Dibbles, MD  diclofenac (VOLTAREN) 75 MG EC tablet TK 1 T PO  BID 12/08/17   [provider]  hydrochlorothiazide (HYDRODIURIL) 25 MG tablet TK 1 T PO  QD 12/08/17   [provider]  lidocaine (LIDODERM) 5 % Place 1 patch onto the skin daily. Remove & Discard patch within 12 hours or as directed by MD 08/11/20   Linwood Dibbles, MD  lisinopril (PRINIVIL,ZESTRIL) 20 MG tablet TK 1 T PO  BID 12/08/17  [provider]  lovastatin (MEVACOR) 10 MG tablet TK 1 T PO  QHS 12/26/17   [provider]  metFORMIN (GLUCOPHAGE) 1000 MG tablet TK 1 T PO  BID 12/26/17   [provider]    Allergies    Shellfish allergy  Review of Systems   Review of Systems  All other systems reviewed and are negative.   Physical Exam Updated Vital Signs BP 112/75 (BP Location: Left Arm)    Pulse (!) 56    Temp 98.2 F (36.8 C) (Oral)    Resp 18    SpO2 99%   Physical Exam Vitals and nursing note reviewed.  Constitutional:      General: He is not in acute distress.    Appearance: He is well-developed.  HENT:     Head: Normocephalic and atraumatic.     Right Ear: External ear normal.     Left Ear: External ear normal.   Eyes:     General: No scleral icterus.       Right eye: No discharge.        Left eye: No discharge.     Conjunctiva/sclera: Conjunctivae normal.  Neck:     Trachea: No tracheal deviation.  Cardiovascular:     Rate and Rhythm: Normal rate and regular rhythm.  Pulmonary:     Effort: Pulmonary effort is normal. No respiratory distress.     Breath sounds: Normal breath sounds. No stridor. No wheezing or rales.  Abdominal:     General: Bowel sounds are normal. There is no distension.     Palpations: Abdomen is soft.     Tenderness: There is no abdominal tenderness. There is no guarding or rebound.  Musculoskeletal:        General: No tenderness.     Cervical back: Neck supple.  Skin:    General: Skin is warm and dry.     Findings: No rash.  Neurological:     Mental Status: He is alert.     Cranial Nerves: No cranial nerve deficit (no facial droop, extraocular movements intact, no slurred speech).     Sensory: No sensory deficit.     Motor: No abnormal muscle tone or seizure activity.     Coordination: Coordination normal.     ED Results / Procedures / Treatments   Labs (all labs ordered are listed, but only abnormal results are displayed) Labs Reviewed  BASIC METABOLIC PANEL - Abnormal; Notable for the following components:      Result Value   BUN 5 (*)    All other components within normal limits  URINALYSIS, ROUTINE W REFLEX MICROSCOPIC - Abnormal; Notable for the following components:   APPearance HAZY (*)    Bilirubin Urine SMALL (*)    Ketones, ur 80 (*)    Protein, ur 100 (*)    Bacteria, UA RARE (*)    All other components within normal limits  CBC    Procedures Procedures (including critical care time)  Medications Ordered in ED Medications - No data to display  ED Course  I have reviewed the triage vital signs and the nursing notes.  Pertinent labs & imaging results that were available during my care of the patient were reviewed by me and considered in  my medical decision making (see chart for details).  Clinical Course as of Aug 11 1114  [facility] Aug 11, 2020  1054 Urine does show ketones and protein but no signs of infection or hematuria metabolic panel and CBC  normal   [JK]    Clinical Course User Index [JK] Linwood Dibbles, MD   MDM Rules/Calculators/A&P                         Patient presented with persistent back pain.  Ongoing for several months but recently worsened again after working.  Patient stands a lot while at work.  Physical exam is reassuring.  No acute neurovascular findings.  Patient was concerned about the possibility of renal etiology.  Laboratory tests are normal.  Urinalysis does not show any signs of bladder infection.  I doubt ureteral colic.  Symptoms sound to be more musculoskeletal in nature.  Will discharge home with prescription for Flexeril and lidocaine.  Discussed outpatient continued management by his chiropractor.  Final Clinical Impression(s) / ED Diagnoses Final diagnoses:  Left-sided back pain, unspecified back location, unspecified chronicity    Rx / DC Orders ED Discharge Orders         Ordered    lidocaine (LIDODERM) 5 %  Every 24 hours        08/11/20 1113    cyclobenzaprine (FLEXERIL) 10 MG tablet  2 times daily PRN        08/11/20 1113           Linwood Dibbles, MD 08/11/20 1115

## 2020-08-11 NOTE — ED Triage Notes (Signed)
Patient reports ongoing L back pain for the past few months, states he has had scans done with no explanation for pain. Reports that he had gastric sleeve and was out of work x6 weeks, pain improved then, has since worsened once he started back to work. Denies urinary symptoms or radiation of pain. Ambulatory with nad.

## 2020-08-11 NOTE — ED Notes (Signed)
Pt d/c home per MD order. Discharge summary reviewed with pt, pt verbalizes understanding. Ambulatory off unit. No s/s of acute distress noted at discharge.  °

## 2021-03-06 ENCOUNTER — Other Ambulatory Visit: Payer: Self-pay

## 2021-03-06 ENCOUNTER — Emergency Department (HOSPITAL_COMMUNITY)
Admission: EM | Admit: 2021-03-06 | Discharge: 2021-03-06 | Disposition: A | Discharge status: Home / Self Care | Payer: 59 | Attending: Emergency Medicine | Admitting: Emergency Medicine

## 2021-03-06 ENCOUNTER — Ambulatory Visit: Admission: EM | Admit: 2021-03-06 | Discharge: 2021-03-06 | Discharge status: Against Medical Advice | Payer: Self-pay

## 2021-03-06 ENCOUNTER — Emergency Department (HOSPITAL_COMMUNITY): Payer: 59

## 2021-03-06 DIAGNOSIS — I10 Essential (primary) hypertension: Secondary | ICD-10-CM | POA: Insufficient documentation

## 2021-03-06 DIAGNOSIS — Z87891 Personal history of nicotine dependence: Secondary | ICD-10-CM | POA: Diagnosis not present

## 2021-03-06 DIAGNOSIS — R002 Palpitations: Secondary | ICD-10-CM | POA: Diagnosis not present

## 2021-03-06 DIAGNOSIS — R079 Chest pain, unspecified: Secondary | ICD-10-CM

## 2021-03-06 DIAGNOSIS — Z20822 Contact with and (suspected) exposure to covid-19: Secondary | ICD-10-CM | POA: Diagnosis not present

## 2021-03-06 DIAGNOSIS — Z79899 Other long term (current) drug therapy: Secondary | ICD-10-CM | POA: Diagnosis not present

## 2021-03-06 DIAGNOSIS — R0789 Other chest pain: Secondary | ICD-10-CM | POA: Insufficient documentation

## 2021-03-06 DIAGNOSIS — Z7984 Long term (current) use of oral hypoglycemic drugs: Secondary | ICD-10-CM | POA: Insufficient documentation

## 2021-03-06 DIAGNOSIS — E119 Type 2 diabetes mellitus without complications: Secondary | ICD-10-CM | POA: Insufficient documentation

## 2021-03-06 DIAGNOSIS — R0602 Shortness of breath: Secondary | ICD-10-CM | POA: Diagnosis not present

## 2021-03-06 LAB — CBC WITH DIFFERENTIAL/PLATELET
Abs Immature Granulocytes: 0.02 10*3/uL (ref 0.00–0.07)
Basophils Absolute: 0 10*3/uL (ref 0.0–0.1)
Basophils Relative: 1 %
Eosinophils Absolute: 0.1 10*3/uL (ref 0.0–0.5)
Eosinophils Relative: 3 %
HCT: 49.8 % (ref 39.0–52.0)
Hemoglobin: 16 g/dL (ref 13.0–17.0)
Immature Granulocytes: 0 %
Lymphocytes Relative: 32 %
Lymphs Abs: 1.5 10*3/uL (ref 0.7–4.0)
MCH: 29.6 pg (ref 26.0–34.0)
MCHC: 32.1 g/dL (ref 30.0–36.0)
MCV: 92.2 fL (ref 80.0–100.0)
Monocytes Absolute: 0.4 10*3/uL (ref 0.1–1.0)
Monocytes Relative: 9 %
Neutro Abs: 2.5 10*3/uL (ref 1.7–7.7)
Neutrophils Relative %: 55 %
Platelets: 259 10*3/uL (ref 150–400)
RBC: 5.4 MIL/uL (ref 4.22–5.81)
RDW: 13.5 % (ref 11.5–15.5)
WBC: 4.6 10*3/uL (ref 4.0–10.5)
nRBC: 0 % (ref 0.0–0.2)

## 2021-03-06 LAB — BASIC METABOLIC PANEL
Anion gap: 10 (ref 5–15)
BUN: 9 mg/dL (ref 6–20)
CO2: 24 mmol/L (ref 22–32)
Calcium: 9.6 mg/dL (ref 8.9–10.3)
Chloride: 105 mmol/L (ref 98–111)
Creatinine, Ser: 1.01 mg/dL (ref 0.61–1.24)
GFR, Estimated: >60 mL/min (ref >60)
Glucose, Bld: 75 mg/dL (ref 70–99)
Potassium: 4.6 mmol/L (ref 3.5–5.1)
Sodium: 139 mmol/L (ref 135–145)

## 2021-03-06 LAB — SARS CORONAVIRUS 2 (TAT 6-24 HRS): SARS Coronavirus 2: NEGATIVE

## 2021-03-06 LAB — TROPONIN I (HIGH SENSITIVITY)
Troponin I (High Sensitivity): 7 ng/L (ref <18)
Troponin I (High Sensitivity): 4 ng/L (ref <18)

## 2021-03-06 NOTE — ED Provider Notes (Signed)
[facility] EMERGENCY DEPARTMENT Provider Note   CSN: [id number] Arrival date & time: 03/06/21  1214     History Chief Complaint  Patient presents with   Chest Pain    Bernard Baker is a 45 y.o. male.  HPI  Patient is a 45 year old male with a history of hypertension, palpitations, who presents to the emergency department due to chest pain and palpitations.  Patient states his symptoms started this morning while standing at work.  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  Nonradiating.  At times would radiate into the left arm but he states that it did not radiate down the left arm.  Sometimes he would have pain in the elbow and at other times in the hand.  He reports chronic mild shortness of breath due to history of smoking which she does not feel has acutely worsened.  He smokes about 1/2 pack/day.  Also notes intermittent palpitations when his chest pain occurs.  He states that he has been evaluated for his palpitations in the past by cardiology and has worn a cardiac monitor.  Denies any nausea, vomiting, or diaphoresis when his episodes occur.  Denies any leg swelling, recent travel, recent surgeries, history of cancer or recent cancer treatments, hemoptysis, or hormone use.     Past Medical History:  Diagnosis Date   Diabetes mellitus without complication (HCC)    Hypertension    Obesity     Patient Active Problem List   Diagnosis Date Noted   Palpitations 01/10/2018   History of cardiac murmur 01/10/2018   Essential hypertension 01/10/2018   Non-insulin dependent type 2 diabetes mellitus (HCC) 01/10/2018   Morbid obesity (HCC) 01/10/2018   Sleep apnea 01/10/2018    No past surgical history on file.     Family History  Problem Relation Age of Onset   Diabetes Mother    Heart Problems Mother    Stroke Father    Heart Problems Father    Heart Problems Maternal  Grandmother    Diabetes Maternal Grandmother    Hyperlipidemia Maternal Grandmother    Hypertension Maternal Grandmother    Alzheimer's disease Maternal Grandfather    Blindness Son    Blindness Daughter    Autism Daughter     Social History   Tobacco Use   Smoking status: Former    Pack years: 0.00    Types: Cigarettes    Quit date: 2015    Years since quitting: 7.4   Smokeless tobacco: Never  Vaping Use   Vaping Use: Former  Substance Use Topics   Alcohol use: No   Drug use: No    Home Medications Prior to Admission medications   Medication Sig Start Date End Date Taking? Authorizing Provider  cyclobenzaprine (FLEXERIL) 10 MG tablet Take 1 tablet (10 mg total) by mouth 2 (two) times daily as needed for muscle spasms. 08/11/20   Linwood Dibbles, MD  diclofenac (VOLTAREN) 75 MG EC tablet TK 1 T PO  BID 12/08/17   [provider]  hydrochlorothiazide (HYDRODIURIL) 25 MG tablet TK 1 T PO  QD 12/08/17   [provider]  lidocaine (LIDODERM) 5 % Place 1 patch onto the skin daily. Remove & Discard patch within 12 hours or as directed by MD 08/11/20   Linwood Dibbles, MD  lisinopril (PRINIVIL,ZESTRIL) 20 MG tablet TK 1 T PO  BID 12/08/17   [provider]  lovastatin (MEVACOR) 10  MG tablet TK 1 T PO  QHS 12/26/17   [provider]  metFORMIN (GLUCOPHAGE) 1000 MG tablet TK 1 T PO  BID 12/26/17   [provider]    Allergies    Shellfish allergy  Review of Systems   Review of Systems  All other systems reviewed and are negative. Ten systems reviewed and are negative for acute change, except as noted in the HPI.   Physical Exam Updated Vital Signs BP 106/62   Pulse (!) 57   Temp 98.4 F (36.9 C) (Oral)   Resp (!) 23   Ht 5\' 8"  (1.727 m)   Wt 84.4 kg   SpO2 100%   BMI 28.28 kg/m   Physical Exam Vitals and nursing note reviewed.  Constitutional:      General: He is not in acute distress.    Appearance: Normal appearance. He is  well-developed and normal weight. He is not ill-appearing, toxic-appearing or diaphoretic.  HENT:     Head: Normocephalic and atraumatic.     Right Ear: External ear normal.     Left Ear: External ear normal.     Nose: Nose normal.     Mouth/Throat:     Mouth: Mucous membranes are moist.     Pharynx: Oropharynx is clear. No oropharyngeal exudate or posterior oropharyngeal erythema.  Eyes:     Extraocular Movements: Extraocular movements intact.  Cardiovascular:     Rate and Rhythm: Normal rate and regular rhythm.     Pulses: Normal pulses.          Radial pulses are 2+ on the right side and 2+ on the left side.       Dorsalis pedis pulses are 2+ on the right side and 2+ on the left side.     Heart sounds: Normal heart sounds. Heart sounds not distant. No murmur heard. No systolic murmur is present.    No friction rub. No gallop.  Pulmonary:     Effort: Pulmonary effort is normal. No tachypnea, accessory muscle usage or respiratory distress.     Breath sounds: Normal breath sounds. No stridor. No decreased breath sounds, wheezing, rhonchi or rales.  Abdominal:     General: Abdomen is flat.     Tenderness: There is no abdominal tenderness.  Musculoskeletal:        General: Normal range of motion.     Cervical back: Normal range of motion and neck supple. No tenderness.     Right lower leg: No tenderness. No edema.     Left lower leg: No tenderness. No edema.     Comments: No leg swelling or calf tenderness noted.  Skin:    General: Skin is warm and dry.  Neurological:     General: No focal deficit present.     Mental Status: He is alert and oriented to person, place, and time.  Psychiatric:        Mood and Affect: Mood normal.        Behavior: Behavior normal.    ED Results / Procedures / Treatments   Labs (all labs ordered are listed, but only abnormal results are displayed) Labs Reviewed  SARS CORONAVIRUS 2 (TAT 6-24 HRS)  BASIC METABOLIC PANEL  CBC WITH  DIFFERENTIAL/PLATELET  TROPONIN I (HIGH SENSITIVITY)  TROPONIN I (HIGH SENSITIVITY)   EKG EKG Interpretation  Date/Time:  Friday March 06 2021 12:19:19 EDT Ventricular Rate:  73 PR Interval:  118 QRS Duration: 78 QT Interval:  394 QTC Calculation: 434 R  Axis:   20 Text Interpretation: Normal sinus rhythm Normal ECG Confirmed by Eber Hong ([id number]) on 03/06/2021 5:06:22 PM  Radiology DG Chest 2 View  Result Date: 03/06/2021 CLINICAL DATA:  Chest pain and cardiac arrhythmia EXAM: CHEST - 2 VIEW COMPARISON:  June 10, 2017 FINDINGS: Lungs are clear. Heart size and pulmonary vascularity are normal. No adenopathy. No pneumothorax. There is degenerative change in the thoracic spine. IMPRESSION: Lungs clear.  Cardiac silhouette normal. Electronically Signed   By: Bretta Bang III M.D.   On: 03/06/2021 13:31    Procedures Procedures   Medications Ordered in ED Medications - No data to display  ED Course  I have reviewed the triage vital signs and the nursing notes.  Pertinent labs & imaging results that were available during my care of the patient were reviewed by me and considered in my medical decision making (see chart for details).    MDM Rules/Calculators/A&P                          Pt is a 45 y.o. male who presents to the emergency department due to chest tightness and palpitations.  Labs: CBC without abnormalities. BMP without abnormalities. Respiratory panel was negative. Troponin of 7 with a repeat of 4.  Imaging: Chest x-ray is negative.  ECG: Normal sinus rhythm.  I, Placido Sou, PA-C, personally reviewed and evaluated these images and lab results as part of my medical decision-making.  Unsure of the source of the patient's symptoms.  Vital signs of been within normal limits since arriving.  ECG showing normal sinus rhythm with a repeat showing sinus rhythm.  Chest x-ray is negative.  Reassuring troponins.  Heart score of 3.  PERC negative.  Feel  the patient is stable for discharge at this time and he is agreeable.  He is going to follow-up with his cardiologist on Monday morning.  We discussed return precautions at length.  He verbalized understanding of the above plan.  His questions were answered and he was amicable at the time of discharge.  Note: Portions of this report may have been transcribed using voice recognition software. Every effort was made to ensure accuracy; however, inadvertent computerized transcription errors may be present.   Final Clinical Impression(s) / ED Diagnoses Final diagnoses:  Chest pain, unspecified type   Rx / DC Orders ED Discharge Orders     None        Placido Sou, PA-C 03/06/21 1910    Eber Hong, MD 03/09/21 1457

## 2021-03-06 NOTE — ED Provider Notes (Signed)
Emergency Medicine Provider Triage Evaluation Note  Bernard Baker , a 45 y.o. male  was evaluated in triage.  Pt complains of chest pain described as sharpness and pressure located to the center of the chest. Sxs intermittent. Reports associated dizziness. Denies nv. States he is chronically sob.  States he took his temp pta and his temp was 100.65F. he was seen at [facility] pta and had a neg covid test.   Review of Systems  Positive: Chest pain, sob, dizziness Negative: nv  Physical Exam  BP 135/74 (BP Location: Left Arm)   Pulse 71   Temp 98.6 F (37 C) (Oral)   Resp 16   SpO2 100%  Gen:   Awake, no distress   Resp:  Normal effort  MSK:   Moves extremities without difficulty  Other:  Heart with rrr, lungs ctab  Medical Decision Making  Medically screening exam initiated at 12:37 PM.  Appropriate orders placed.  Bernard Baker was informed that the remainder of the evaluation will be completed by another provider, this initial triage assessment does not replace that evaluation, and the importance of remaining in the ED until their evaluation is complete.     Bernard Baker 03/06/21 1242    Bernard Mulders, MD 03/07/21 [phone]

## 2021-03-06 NOTE — Discharge Instructions (Addendum)
Please continue to monitor your symptoms closely and return to the emergency department if you develop any new or worsening symptoms.  Otherwise, I would recommend you follow-up with your cardiologist next week and schedule an appointment for reevaluation.  It was a pleasure to meet you.

## 2021-03-06 NOTE — ED Triage Notes (Signed)
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 Endorses dizziness and mild shob, although shob unchanged from his baseline as he has a hx of smoking.

## 2021-03-07 ENCOUNTER — Emergency Department (HOSPITAL_COMMUNITY)
Admission: EM | Admit: 2021-03-07 | Discharge: 2021-03-07 | Discharge status: Against Medical Advice | Payer: 59 | Attending: Emergency Medicine | Admitting: Emergency Medicine

## 2021-03-07 ENCOUNTER — Other Ambulatory Visit: Payer: Self-pay

## 2021-03-07 ENCOUNTER — Encounter (HOSPITAL_COMMUNITY): Payer: Self-pay | Admitting: Emergency Medicine

## 2021-03-07 DIAGNOSIS — Z5321 Procedure and treatment not carried out due to patient leaving prior to being seen by health care provider: Secondary | ICD-10-CM | POA: Insufficient documentation

## 2021-03-07 DIAGNOSIS — R110 Nausea: Secondary | ICD-10-CM | POA: Diagnosis not present

## 2021-03-07 DIAGNOSIS — R42 Dizziness and giddiness: Secondary | ICD-10-CM | POA: Insufficient documentation

## 2021-03-07 DIAGNOSIS — R079 Chest pain, unspecified: Secondary | ICD-10-CM | POA: Insufficient documentation

## 2021-03-07 LAB — CBC WITH DIFFERENTIAL/PLATELET
Abs Immature Granulocytes: 0.01 10*3/uL (ref 0.00–0.07)
Basophils Absolute: 0 10*3/uL (ref 0.0–0.1)
Basophils Relative: 1 %
Eosinophils Absolute: 0.2 10*3/uL (ref 0.0–0.5)
Eosinophils Relative: 4 %
HCT: 45.3 % (ref 39.0–52.0)
Hemoglobin: 14.6 g/dL (ref 13.0–17.0)
Immature Granulocytes: 0 %
Lymphocytes Relative: 48 %
Lymphs Abs: 2.3 10*3/uL (ref 0.7–4.0)
MCH: 29.4 pg (ref 26.0–34.0)
MCHC: 32.2 g/dL (ref 30.0–36.0)
MCV: 91.1 fL (ref 80.0–100.0)
Monocytes Absolute: 0.4 10*3/uL (ref 0.1–1.0)
Monocytes Relative: 9 %
Neutro Abs: 1.8 10*3/uL (ref 1.7–7.7)
Neutrophils Relative %: 38 %
Platelets: 277 10*3/uL (ref 150–400)
RBC: 4.97 MIL/uL (ref 4.22–5.81)
RDW: 13.6 % (ref 11.5–15.5)
WBC: 4.7 10*3/uL (ref 4.0–10.5)
nRBC: 0 % (ref 0.0–0.2)

## 2021-03-07 LAB — BASIC METABOLIC PANEL
Anion gap: 9 (ref 5–15)
BUN: 6 mg/dL (ref 6–20)
CO2: 25 mmol/L (ref 22–32)
Calcium: 9.6 mg/dL (ref 8.9–10.3)
Chloride: 105 mmol/L (ref 98–111)
Creatinine, Ser: 0.97 mg/dL (ref 0.61–1.24)
GFR, Estimated: >60 mL/min (ref >60)
Glucose, Bld: 85 mg/dL (ref 70–99)
Potassium: 3.8 mmol/L (ref 3.5–5.1)
Sodium: 139 mmol/L (ref 135–145)

## 2021-03-07 LAB — TROPONIN I (HIGH SENSITIVITY): Troponin I (High Sensitivity): 3 ng/L (ref <18)

## 2021-03-07 NOTE — ED Provider Notes (Signed)
Emergency Medicine Provider Triage Evaluation Note  Shann Merrick , a 45 y.o. male  was evaluated in triage.  Pt complains of chest pain.  Seen yesterday for similar complaints.  States he went home and has since been having recurrent episode of chest pain.  Is not exertional in nature.  Located to center of his chest.  States he has known history of arrhythmia however is not seen cardiology over the last 4 years.  Has been having some intermittent palpitations.  No prior history of PE or DVT.  No recent surgery, long road trips.  No back pain.  Review of Systems  Positive: CP, SOB Negative: Back pain, LE edema  Physical Exam  BP 133/69 (BP Location: Right Arm)   Pulse (!) 40   Resp 18   SpO2 100%  Gen:   Awake, no distress   Resp:  Normal effort  MSK:   Moves extremities without difficulty  Other:    Medical Decision Making  Medically screening exam initiated at 9:40 PM.  Appropriate orders placed.  Ethen Kroeze was informed that the remainder of the evaluation will be completed by another provider, this initial triage assessment does not replace that evaluation, and the importance of remaining in the ED until their evaluation is complete.  CP, SOB   Azalie Harbeck A, PA-C 03/07/21 2140    Cathren Laine, MD 03/08/21 1709

## 2021-03-07 NOTE — ED Triage Notes (Signed)
Pt c/o left cp since yesterday with dizziness and nausea, and uncontrol HTN.

## 2021-03-07 NOTE — ED Notes (Signed)
PT decided to leave . 

## 2021-03-28 ENCOUNTER — Emergency Department (HOSPITAL_COMMUNITY)
Admission: EM | Admit: 2021-03-28 | Discharge: 2021-03-28 | Disposition: A | Discharge status: Home / Self Care | Payer: 59 | Attending: Emergency Medicine | Admitting: Emergency Medicine

## 2021-03-28 ENCOUNTER — Other Ambulatory Visit: Payer: Self-pay

## 2021-03-28 ENCOUNTER — Encounter (HOSPITAL_COMMUNITY): Payer: Self-pay | Admitting: *Deleted

## 2021-03-28 DIAGNOSIS — Z5321 Procedure and treatment not carried out due to patient leaving prior to being seen by health care provider: Secondary | ICD-10-CM | POA: Insufficient documentation

## 2021-03-28 DIAGNOSIS — R519 Headache, unspecified: Secondary | ICD-10-CM | POA: Diagnosis not present

## 2021-03-28 NOTE — ED Triage Notes (Signed)
Headache for one hour  he did not take any med before he came here

## 2021-03-28 NOTE — ED Notes (Signed)
The pt asked that his vp be checked one more time.   When he saw his bp he decided to go home and take some tylenol  He left

## 2021-04-28 ENCOUNTER — Other Ambulatory Visit: Payer: Self-pay

## 2021-04-28 DIAGNOSIS — I472 Ventricular tachycardia: Secondary | ICD-10-CM | POA: Insufficient documentation

## 2021-04-28 DIAGNOSIS — E669 Obesity, unspecified: Secondary | ICD-10-CM | POA: Insufficient documentation

## 2021-04-28 DIAGNOSIS — I10 Essential (primary) hypertension: Secondary | ICD-10-CM | POA: Insufficient documentation

## 2021-04-28 DIAGNOSIS — I4729 Other ventricular tachycardia: Secondary | ICD-10-CM | POA: Insufficient documentation

## 2021-04-28 DIAGNOSIS — E119 Type 2 diabetes mellitus without complications: Secondary | ICD-10-CM | POA: Insufficient documentation

## 2021-04-28 DIAGNOSIS — Z9884 Bariatric surgery status: Secondary | ICD-10-CM | POA: Insufficient documentation

## 2021-05-14 ENCOUNTER — Other Ambulatory Visit: Payer: Self-pay

## 2021-05-14 ENCOUNTER — Ambulatory Visit (INDEPENDENT_AMBULATORY_CARE_PROVIDER_SITE_OTHER): Payer: 59 | Admitting: Cardiology

## 2021-05-14 ENCOUNTER — Encounter: Payer: Self-pay | Admitting: Cardiology

## 2021-05-14 VITALS — BP 120/90 | HR 65 | Ht 68.0 in | Wt 197.0 lb

## 2021-05-14 DIAGNOSIS — Z8679 Personal history of other diseases of the circulatory system: Secondary | ICD-10-CM

## 2021-05-14 DIAGNOSIS — I493 Ventricular premature depolarization: Secondary | ICD-10-CM | POA: Insufficient documentation

## 2021-05-14 DIAGNOSIS — I472 Ventricular tachycardia: Secondary | ICD-10-CM | POA: Diagnosis not present

## 2021-05-14 DIAGNOSIS — Z9884 Bariatric surgery status: Secondary | ICD-10-CM | POA: Diagnosis not present

## 2021-05-14 DIAGNOSIS — I10 Essential (primary) hypertension: Secondary | ICD-10-CM | POA: Diagnosis not present

## 2021-05-14 DIAGNOSIS — I4729 Other ventricular tachycardia: Secondary | ICD-10-CM

## 2021-05-14 DIAGNOSIS — E781 Pure hyperglyceridemia: Secondary | ICD-10-CM | POA: Insufficient documentation

## 2021-05-14 HISTORY — DX: Ventricular premature depolarization: I49.3

## 2021-05-14 HISTORY — DX: Pure hyperglyceridemia: E78.1

## 2021-05-14 NOTE — Patient Instructions (Signed)
Medication Instructions:  Your physician recommends that you continue on your current medications as directed. Please refer to the Current Medication list given to you today.  *If you need a refill on your cardiac medications before your next appointment, please call your pharmacy*   Lab Work: Your physician recommends that you have a BMET and magnesium done today in the office.  If you have labs (blood work) drawn today and your tests are completely normal, you will receive your results only by: MyChart Message (if you have MyChart) OR A paper copy in the mail If you have any lab test that is abnormal or we need to change your treatment, we will call you to review the results.   Testing/Procedures: Your physician has requested that you have a lexiscan myoview. For further information please visit [email or web address] Please follow instruction sheet, as given.  The test will take approximately 3 to 4 hours to complete; you may bring reading material.  If someone comes with you to your appointment, they will need to remain in the main lobby due to limited space in the testing area.   How to prepare for your Myocardial Perfusion Test: Do not eat or drink 3 hours prior to your test, except you may have water. Do not consume products containing caffeine (regular or decaffeinated) 12 hours prior to your test. (ex: coffee, chocolate, sodas, tea). Do bring a list of your current medications with you.  If not listed below, you may take your medications as normal. Do wear comfortable clothes (no dresses or overalls) and walking shoes, tennis shoes preferred (No heels or open toe shoes are allowed). Do NOT wear cologne, perfume, aftershave, or lotions (deodorant is allowed). If these instructions are not followed, your test will have to be rescheduled.  Your physician has requested that you have an echocardiogram. Echocardiography is a painless test that uses sound waves to create images of your  heart. It provides your doctor with information about the size and shape of your heart and how well your heart's chambers and valves are working. This procedure takes approximately one hour. There are no restrictions for this procedure.   Follow-Up: At [facility], you and your health needs are our priority.  As part of our continuing mission to provide you with exceptional heart care, we have created designated Provider Care Teams.  These Care Teams include your primary Cardiologist (physician) and Advanced Practice Providers (APPs -  Physician Assistants and Nurse Practitioners) who all work together to provide you with the care you need, when you need it.  We recommend signing up for the patient portal called "MyChart".  Sign up information is provided on this After Visit Summary.  MyChart is used to connect with patients for Virtual Visits (Telemedicine).  Patients are able to view lab/test results, encounter notes, upcoming appointments, etc.  Non-urgent messages can be sent to your provider as well.   To learn more about what you can do with MyChart, go to [email or web address].    Your next appointment:   6 month(s)  The format for your next appointment:   In Person  Provider:   Belva Crome, MD   Other Instructions Cardiac Nuclear Scan A cardiac nuclear scan is a test that is done to check the flow of blood to your heart. It is done when you are resting and when you are exercising. The test looks for problems such as: Not enough blood reaching a portion of the heart. The heart muscle not working as  it should. You may need this test if: You have heart disease. You have had lab results that are not normal. You have had heart surgery or a balloon procedure to open up blocked arteries (angioplasty). You have chest pain. You have shortness of breath. In this test, a special dye (tracer) is put into your bloodstream. The tracer will travel to your heart. A camera will then take  pictures of your heart to see how the tracer moves through your heart. This test is usually done at a hospital and takes 2-4 hours. Tell a doctor about: Any allergies you have. All medicines you are taking, including vitamins, herbs, eye drops, creams, and over-the-counter medicines. Any problems you or family members have had with anesthetic medicines. Any blood disorders you have. Any surgeries you have had. Any medical conditions you have. Whether you are pregnant or may be pregnant. What are the risks? Generally, this is a safe test. However, problems may occur, such as: Serious chest pain and heart attack. This is only a risk if the stress portion of the test is done. Rapid heartbeat. A feeling of warmth in your chest. This feeling usually does not last long. Allergic reaction to the tracer. What happens before the test? Ask your doctor about changing or stopping your normal medicines. This is important. Follow instructions from your doctor about what you cannot eat or drink. Remove your jewelry on the day of the test. What happens during the test? An IV tube will be inserted into one of your veins. Your doctor will give you a small amount of tracer through the IV tube. You will wait for 20-40 minutes while the tracer moves through your bloodstream. Your heart will be monitored with an electrocardiogram (ECG). You will lie down on an exam table. Pictures of your heart will be taken for about 15-20 minutes. You may also have a stress test. For this test, one of these things may be done: You will be asked to exercise on a treadmill or a stationary bike. You will be given medicines that will make your heart work harder. This is done if you are unable to exercise. When blood flow to your heart has peaked, a tracer will again be given through the IV tube. After 20-40 minutes, you will get back on the exam table. More pictures will be taken of your heart. Depending on the tracer that is  used, more pictures may need to be taken 3-4 hours later. Your IV tube will be removed when the test is over. The test may vary among doctors and hospitals. What happens after the test? Ask your doctor: Whether you can return to your normal schedule, including diet, activities, and medicines. Whether you should drink more fluids. This will help to remove the tracer from your body. Drink enough fluid to keep your pee (urine) pale yellow. Ask your doctor, or the department that is doing the test: When will my results be ready? How will I get my results? Summary A cardiac nuclear scan is a test that is done to check the flow of blood to your heart. Tell your doctor whether you are pregnant or may be pregnant. Before the test, ask your doctor about changing or stopping your normal medicines. This is important. Ask your doctor whether you can return to your normal activities. You may be asked to drink more fluids. This information is not intended to replace advice given to you by your health care provider. Make sure you discuss any  questions you have with your health care provider. Document Revised: 01/03/2019 Document Reviewed: 02/27/2018 Elsevier Patient Education  2021 Elsevier Inc.    Echocardiogram An echocardiogram is a test that uses sound waves (ultrasound) to produce images of the heart. Images from an echocardiogram can provide important information about: Heart size and shape. The size and thickness and movement of your heart's walls. Heart muscle function and strength. Heart valve function or if you have stenosis. Stenosis is when the heart valves are too narrow. If blood is flowing backward through the heart valves (regurgitation). A tumor or infectious growth around the heart valves. Areas of heart muscle that are not working well because of poor blood flow or injury from a heart attack. Aneurysm detection. An aneurysm is a weak or damaged part of an artery wall. The wall  bulges out from the normal force of blood pumping through the body. Tell a health care provider about: Any allergies you have. All medicines you are taking, including vitamins, herbs, eye drops, creams, and over-the-counter medicines. Any blood disorders you have. Any surgeries you have had. Any medical conditions you have. Whether you are pregnant or may be pregnant. What are the risks? Generally, this is a safe test. However, problems may occur, including an allergic reaction to dye (contrast) that may be used during the test. What happens before the test? No specific preparation is needed. You may eat and drink normally. What happens during the test? You will take off your clothes from the waist up and put on a hospital gown. Electrodes or electrocardiogram (ECG)patches may be placed on your chest. The electrodes or patches are then connected to a device that monitors your heart rate and rhythm. You will lie down on a table for an ultrasound exam. A gel will be applied to your chest to help sound waves pass through your skin. A handheld device, called a transducer, will be pressed against your chest and moved over your heart. The transducer produces sound waves that travel to your heart and bounce back (or "echo" back) to the transducer. These sound waves will be captured in real-time and changed into images of your heart that can be viewed on a video monitor. The images will be recorded on a computer and reviewed by your health care provider. You may be asked to change positions or hold your breath for a short time. This makes it easier to get different views or better views of your heart. In some cases, you may receive contrast through an IV in one of your veins. This can improve the quality of the pictures from your heart. The procedure may vary among health care providers and hospitals.    What can I expect after the test? You may return to your normal, everyday life, including diet,  activities, and medicines, unless your health care provider tells you not to do that. Follow these instructions at home: It is up to you to get the results of your test. Ask your health care provider, or the department that is doing the test, when your results will be ready. Keep all follow-up visits. This is important. Summary An echocardiogram is a test that uses sound waves (ultrasound) to produce images of the heart. Images from an echocardiogram can provide important information about the size and shape of your heart, heart muscle function, heart valve function, and other possible heart problems. You do not need to do anything to prepare before this test. You may eat and drink normally. After  the echocardiogram is completed, you may return to your normal, everyday life, unless your health care provider tells you not to do that. This information is not intended to replace advice given to you by your health care provider. Make sure you discuss any questions you have with your health care provider. Document Revised: 05/06/2020 Document Reviewed: 05/06/2020 Elsevier Patient Education  2021 Elsevier Inc.  

## 2021-05-14 NOTE — Progress Notes (Signed)
Cardiology Office Note:    Date:  05/14/2021   ID:  Bernard Baker, DOB 18-Jan-1976, MRN [id number]  PCP:  Bernard Baker, Bernard Coffee, MD  Cardiologist:  Bernard Brothers, MD   Referring MD: Bernard Baker, Bernard Baker, *    ASSESSMENT:    1. Nonsustained ventricular tachycardia (HCC)   2. Essential hypertension   3. H/O bariatric surgery   4. History of cardiac murmur    PLAN:    In order of problems listed above:  Primary prevention stressed with the patient.  Importance of compliance with diet medication stressed and vocalized understanding. Nonsustained ventricular tachycardia: Asymptomatic at this time.  Patient has frequent PVCs and he may be feeling post.  He occasionally has skipped beat like sensation in his heart.  I reviewed the monitor report at extensive length and questions were answered to his satisfaction.  To evaluate this I will do exercise stress Cardiolite. Dyspnea on exertion: This is mild and the stress test will also help assess the symptoms.   Cardiac murmur: Echocardiogram will be done to assess murmur heard on auscultation. In view of frequent PVCs and nonsustained ventricular tachycardia I will do a Chem-7 and magnesium level today. Mixed dyslipidemia and hypertriglyceridemia: This is from previous records available in the chart and he will get me a copy of recent blood work and his next visit.  He knows to go to the nearest emergency room for any concerning symptoms.  Weight reduction was stressed and he promises to do better.  I told him to begin an exercise program once this evaluation is complete especially the stress test and he concurs. Patient will be seen in follow-up appointment in 6 months or earlier if the patient has any concerns    Medication Adjustments/Labs and Tests Ordered: Current medicines are reviewed at length with the patient today.  Concerns regarding medicines are outlined above.  No orders of the defined types were placed in this  encounter.  No orders of the defined types were placed in this encounter.    History of Present Illness:    Bernard Baker is a 45 y.o. male who is being seen today for the evaluation of frequent PVCs and nonsustained ventricular tachycardia at the request of Bernard Baker, Bernard Baker, *.  Patient is a pleasant 45 year old male.  He has past medical history of essential hypertension.  He has undergone bariatric surgery.  He mentions to me that he occasionally experiences palpitations.  ZIO monitor has revealed frequent PVCs and increased PVC burden and nonsustained ventricular tachycardia.  Patient denies any chest pain orthopnea or PND.  He has never had a syncopal episode.  At the time of my evaluation, the patient is alert awake oriented and in no distress.  Past Medical History:  Diagnosis Date   Diabetes mellitus without complication (HCC)    Essential hypertension    H/O bariatric surgery    History of cardiac murmur 01/10/2018   Hypertension    Morbid obesity (HCC) 01/10/2018   Non-insulin dependent type 2 diabetes mellitus (HCC) 01/10/2018   Nonsustained ventricular tachycardia (HCC)    Obesity    Palpitations    Sleep apnea 01/10/2018   Pt has poor sleep, daytime fatigue, HTN, morbid obesity, and a history of snoring-r/o sleep apnea    Past Surgical History:  Procedure Laterality Date   LAPAROSCOPIC GASTRIC SLEEVE RESECTION  06/23/2020    Current Medications: Current Meds  Medication Sig   lisinopril (PRINIVIL,ZESTRIL) 20 MG tablet Take 20 mg  by mouth daily.     Allergies:   Shellfish allergy   Social History   Socioeconomic History   Marital status: Married    Spouse name: Not on file   Number of children: Not on file   Years of education: Not on file   Highest education level: Not on file  Occupational History   Not on file  Tobacco Use   Smoking status: Some Days    Types: Cigarettes    Last attempt to quit: 2015    Years since quitting: 7.6   Smokeless  tobacco: Never  Vaping Use   Vaping Use: Former  Substance and Sexual Activity   Alcohol use: No   Drug use: No   Sexual activity: Not on file  Other Topics Concern   Not on file  Social History Narrative   Not on file   Social Determinants of Health   Financial Resource Strain: Not on file  Food Insecurity: Not on file  Transportation Needs: Not on file  Physical Activity: Not on file  Stress: Not on file  Social Connections: Not on file     Family History: The patient's family history includes Alzheimer's disease in his maternal grandfather; Autism in his daughter; Blindness in his daughter and son; Diabetes in his maternal grandmother and mother; Heart Problems in his father, maternal grandmother, and mother; Hyperlipidemia in his maternal grandmother; Hypertension in his maternal grandmother; Stroke in his father.  ROS:   Please see the history of present illness.    All other systems reviewed and are negative.  EKGs/Labs/Other Studies Reviewed:    The following studies were reviewed today: EKG was sinus rhythm with PVCs and nonspecific ST-T changes   Recent Labs: 03/07/2021: BUN 6; Creatinine, Ser 0.97; Hemoglobin 14.6; Platelets 277; Potassium 3.8; Sodium 139  Recent Lipid Panel No results found for: CHOL, TRIG, HDL, CHOLHDL, VLDL, LDLCALC, LDLDIRECT  Physical Exam:    VS:  BP 120/90 (BP Location: Right Arm, Patient Position: Sitting, Cuff Size: Normal)   Pulse 65   Ht 5\' 8"  (1.727 Baker)   Wt 197 lb (89.4 kg)   SpO2 94%   BMI 29.95 kg/Baker     Wt Readings from Last 3 Encounters:  05/14/21 197 lb (89.4 kg)  03/28/21 186 lb 1.1 oz (84.4 kg)  03/06/21 186 lb (84.4 kg)     GEN: Patient is in no acute distress HEENT: Normal NECK: No JVD; No carotid bruits LYMPHATICS: No lymphadenopathy CARDIAC: S1 S2 regular, 2/6 systolic murmur at the apex. RESPIRATORY:  Clear to auscultation without rales, wheezing or rhonchi  ABDOMEN: Soft, non-tender,  non-distended MUSCULOSKELETAL:  No edema; No deformity  SKIN: Warm and dry NEUROLOGIC:  Alert and oriented x 3 PSYCHIATRIC:  Normal affect    Signed, 05/06/21, MD  05/14/2021 4:11 PM    [facility]

## 2021-05-15 LAB — BASIC METABOLIC PANEL
BUN/Creatinine Ratio: 7 — ABNORMAL LOW (ref 9–20)
BUN: 7 mg/dL (ref 6–24)
CO2: 24 mmol/L (ref 20–29)
Calcium: 9.8 mg/dL (ref 8.7–10.2)
Chloride: 104 mmol/L (ref 96–106)
Creatinine, Ser: 0.95 mg/dL (ref 0.76–1.27)
Glucose: 66 mg/dL (ref 65–99)
Potassium: 4.2 mmol/L (ref 3.5–5.2)
Sodium: 142 mmol/L (ref 134–144)
eGFR: 102 mL/min/{1.73_m2} (ref >59)

## 2021-05-15 LAB — MAGNESIUM: Magnesium: 2.3 mg/dL (ref 1.6–2.3)

## 2021-05-26 ENCOUNTER — Telehealth (HOSPITAL_COMMUNITY): Payer: Self-pay | Admitting: *Deleted

## 2021-05-26 NOTE — Telephone Encounter (Signed)
Patient given detailed instructions per Myocardial Perfusion Study Information Sheet for the test on 06/02/21 at 11:15. Patient notified to arrive 15 minutes early and that it is imperative to arrive on time for appointment to keep from having the test rescheduled.  If you need to cancel or reschedule your appointment, please call the office within 24 hours of your appointment. . Patient verbalized understanding.Daneil Dolin

## 2021-06-02 ENCOUNTER — Ambulatory Visit: Payer: 59

## 2021-06-02 ENCOUNTER — Other Ambulatory Visit: Payer: Self-pay

## 2021-06-02 DIAGNOSIS — I4729 Other ventricular tachycardia: Secondary | ICD-10-CM

## 2021-06-02 DIAGNOSIS — I472 Ventricular tachycardia: Secondary | ICD-10-CM

## 2021-06-02 DIAGNOSIS — Z8679 Personal history of other diseases of the circulatory system: Secondary | ICD-10-CM

## 2021-06-08 ENCOUNTER — Telehealth (HOSPITAL_COMMUNITY): Payer: Self-pay

## 2021-06-08 NOTE — Telephone Encounter (Signed)
Spoke with the patient, detailed instructions given. He stated that he would be here for his test. Asked to call back with any questions. S.Zaeden Lastinger EMTP 

## 2021-06-09 ENCOUNTER — Other Ambulatory Visit: Payer: Self-pay

## 2021-06-09 ENCOUNTER — Ambulatory Visit (HOSPITAL_COMMUNITY): Payer: 59 | Attending: Cardiology

## 2021-06-09 VITALS — Ht 68.0 in | Wt 197.0 lb

## 2021-06-09 DIAGNOSIS — I493 Ventricular premature depolarization: Secondary | ICD-10-CM | POA: Insufficient documentation

## 2021-06-09 DIAGNOSIS — I472 Ventricular tachycardia: Secondary | ICD-10-CM | POA: Diagnosis not present

## 2021-06-09 DIAGNOSIS — I4729 Other ventricular tachycardia: Secondary | ICD-10-CM

## 2021-06-09 LAB — MYOCARDIAL PERFUSION IMAGING
Angina Index: 0
Base ST Depression (mm): 0 mm
Duke Treadmill Score: 8
Estimated workload: 9.3
Exercise duration (min): 7 min
Exercise duration (sec): 30 s
LV dias vol: 98 mL (ref 62–150)
LV sys vol: 45 mL
MPHR: 177 {beats}/min
Nuc Stress EF: 54 %
Peak HR: 164 {beats}/min
Percent HR: 92 %
Rest HR: 68 {beats}/min
Rest Nuclear Isotope Dose: 11 mCi
SDS: 0
SRS: 0
SSS: 0
ST Depression (mm): 0 mm
ST Elevation (mm): 0 mm
Stress Nuclear Isotope Dose: 32.9 mCi
TID: 0.96

## 2021-06-09 MED ORDER — TECHNETIUM TC 99M TETROFOSMIN IV KIT
11.0000 | PACK | Freq: Once | INTRAVENOUS | Status: AC | PRN
Start: 2021-06-09 — End: 2021-06-09
  Administered 2021-06-09: 11 via INTRAVENOUS
  Filled 2021-06-09: qty 11

## 2021-06-09 MED ORDER — TECHNETIUM TC 99M TETROFOSMIN IV KIT
32.9000 | PACK | Freq: Once | INTRAVENOUS | Status: AC | PRN
Start: 2021-06-09 — End: 2021-06-09
  Administered 2021-06-09: 32.9 via INTRAVENOUS
  Filled 2021-06-09: qty 33

## 2021-06-10 ENCOUNTER — Telehealth: Payer: Self-pay

## 2021-06-10 NOTE — Telephone Encounter (Signed)
Pt would like to start working out and wanted to know what his max heart rate should be. How do you advise?

## 2021-06-16 IMAGING — CR DG CHEST 2V
2 series · 2 of 2 positions shown · non-contrast
Comparison: June 10, 2017

CLINICAL DATA: Chest pain and cardiac arrhythmia

EXAM:
CHEST - 2 VIEW

[chest pa]
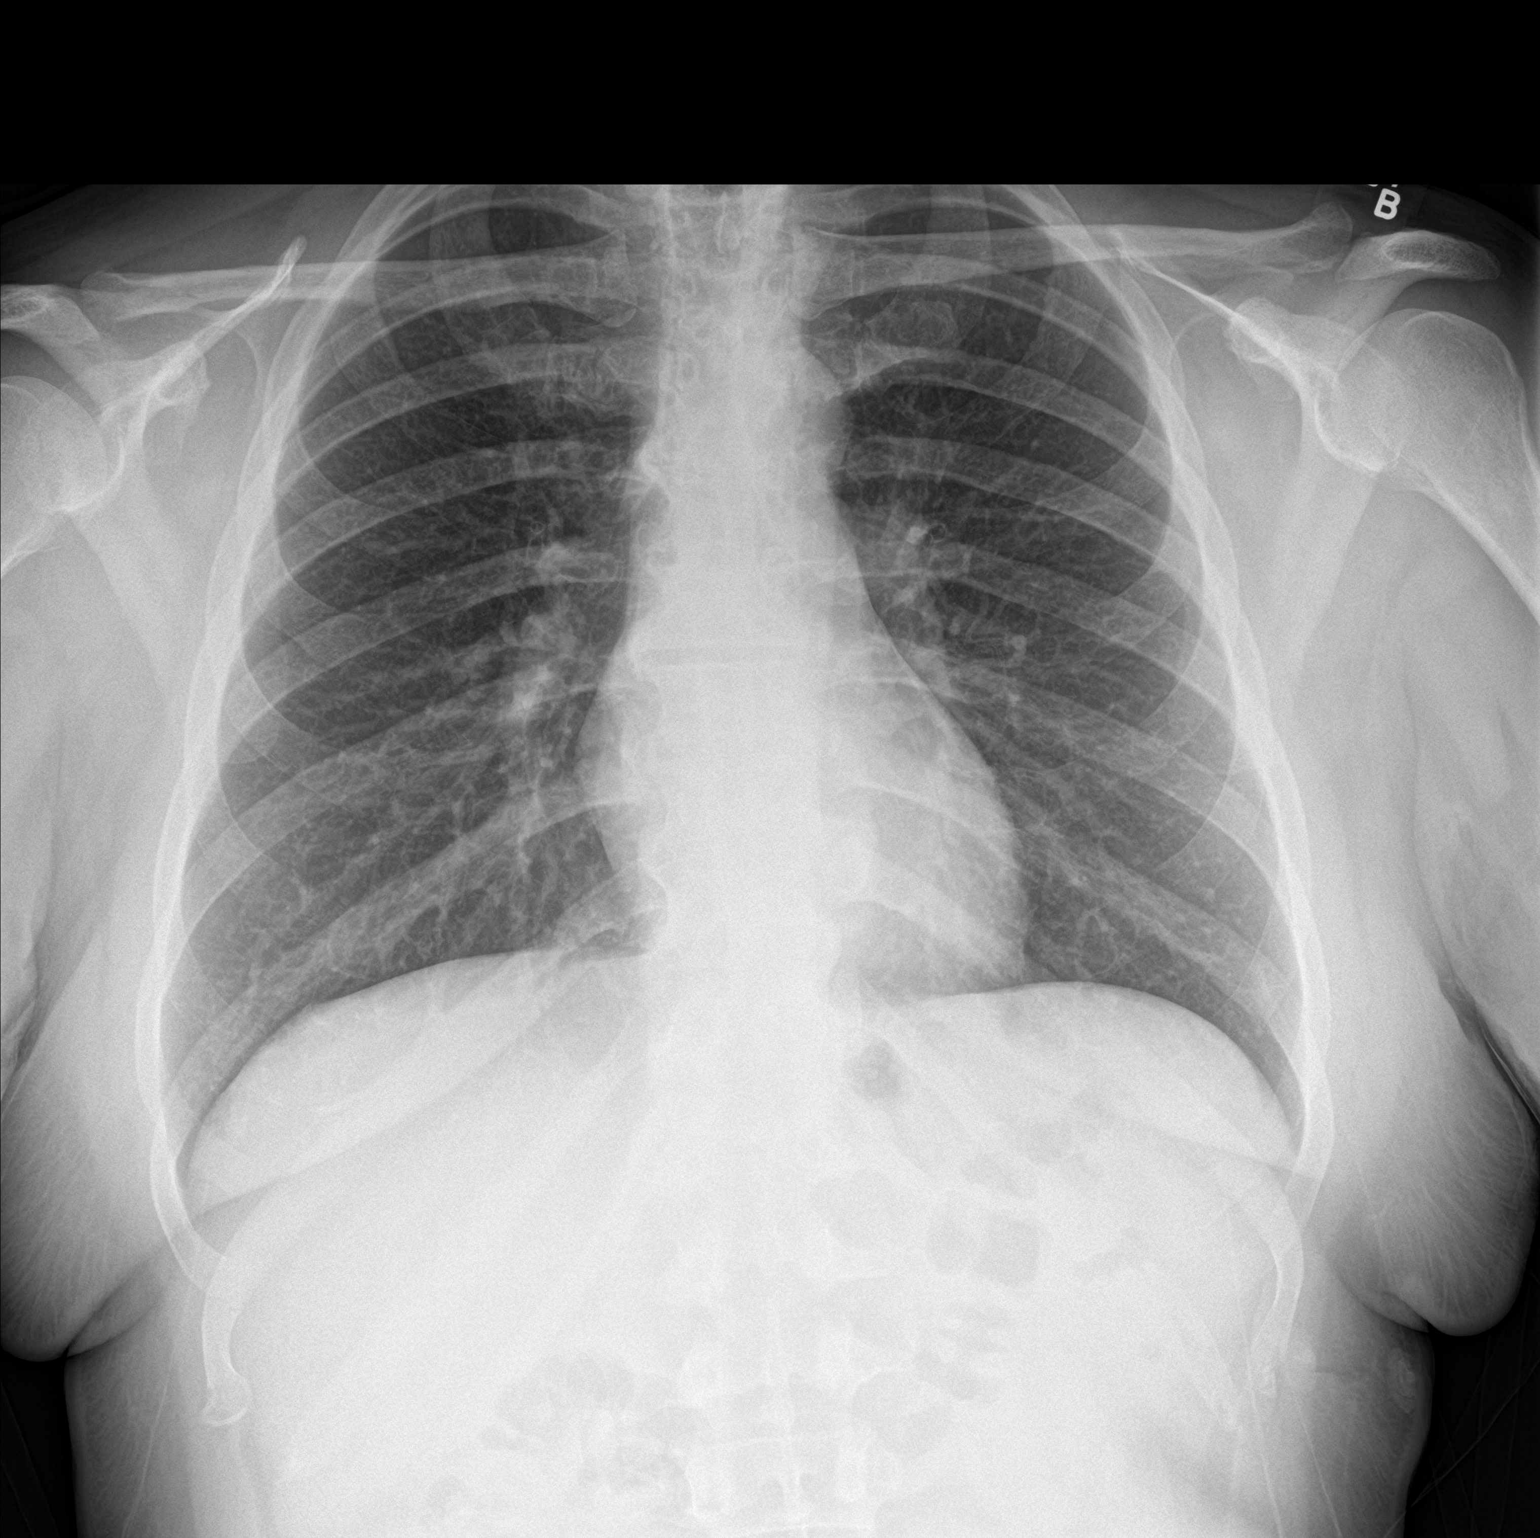

[chest lat]
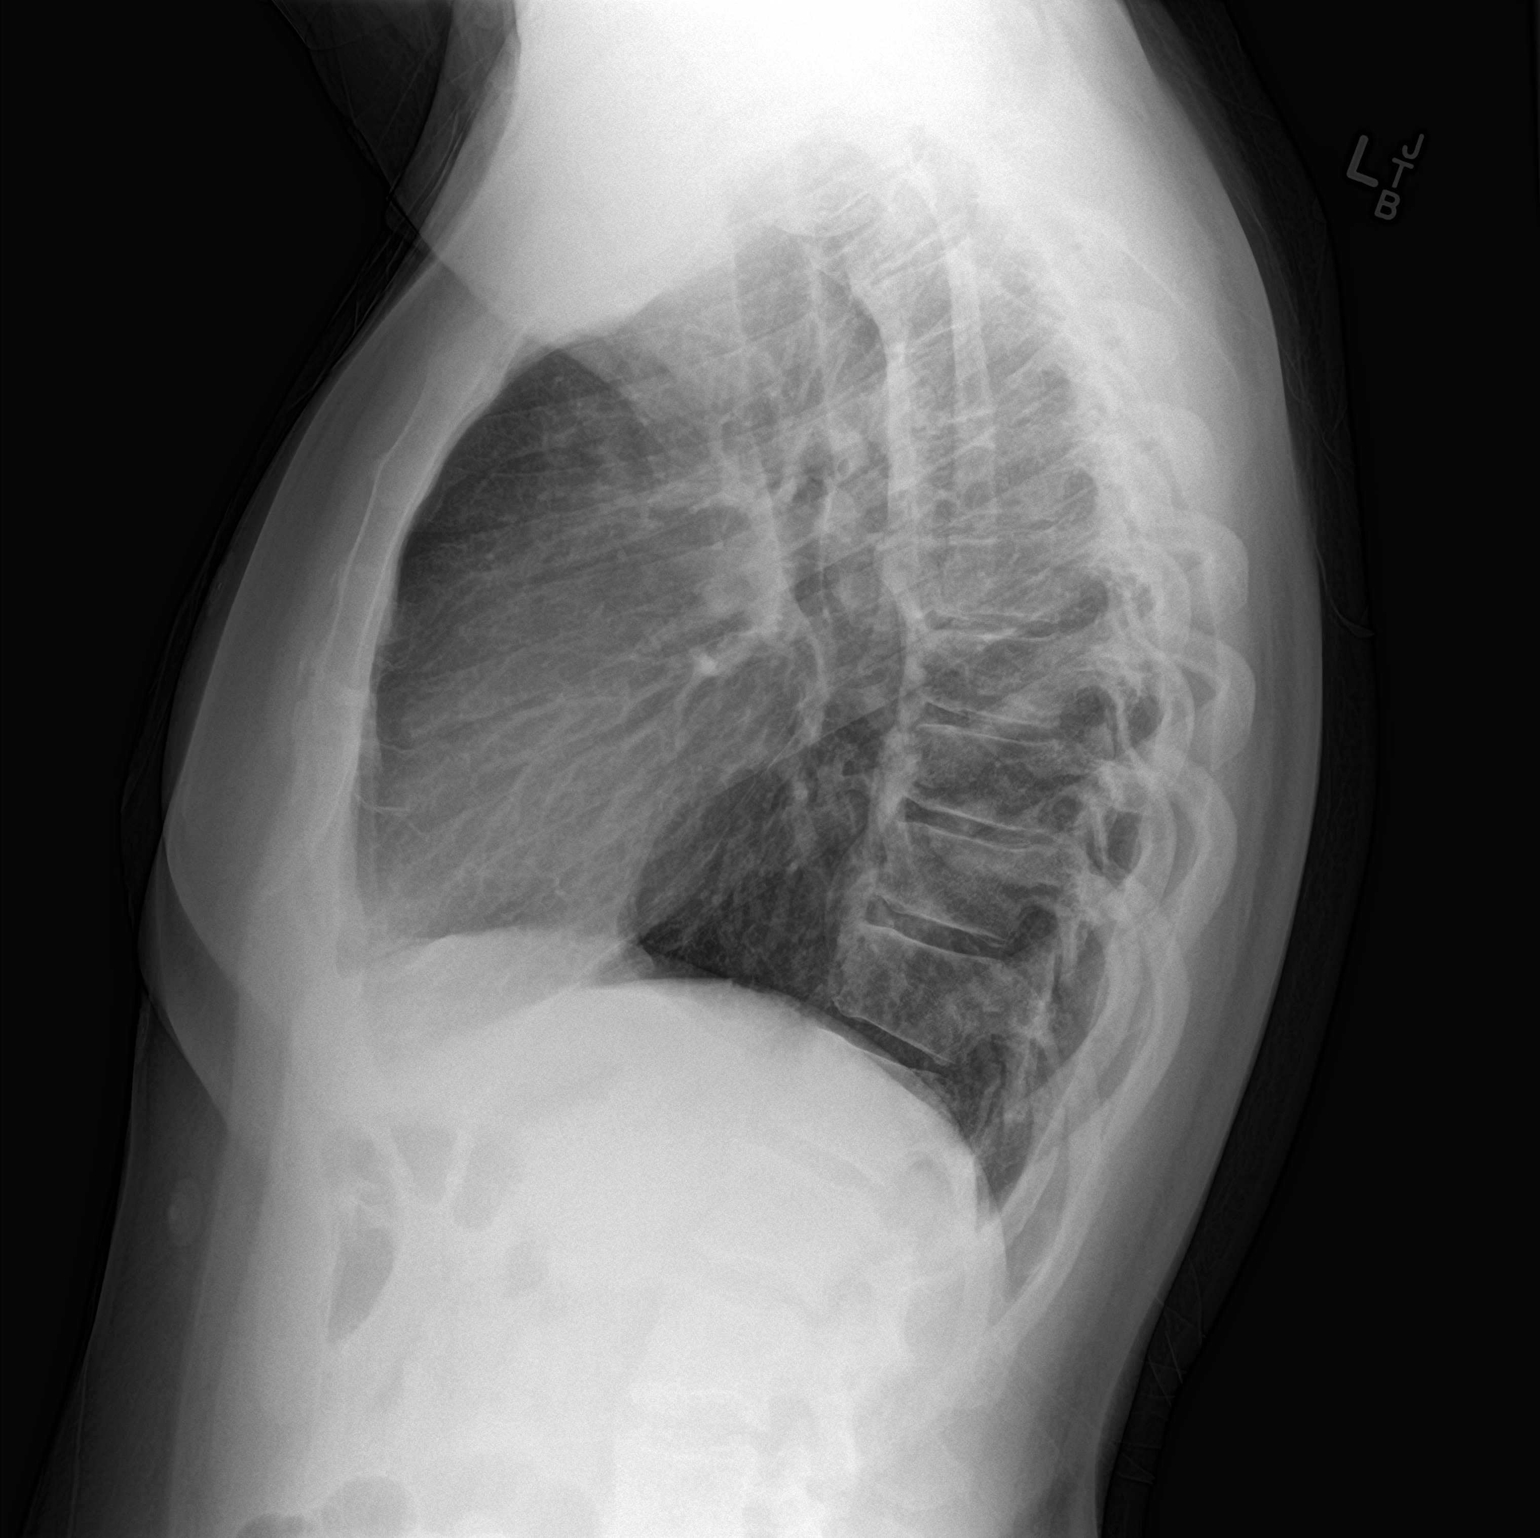

[2 of 2 positions shown; findings below may reference images not displayed]

FINDINGS: Lungs are clear. Heart size and pulmonary vascularity are normal. No
adenopathy. No pneumothorax. There is degenerative change in the
thoracic spine.
IMPRESSION: Lungs clear.  Cardiac silhouette normal.

## 2021-11-17 ENCOUNTER — Ambulatory Visit: Payer: 59 | Admitting: Cardiology

## 2022-03-03 ENCOUNTER — Ambulatory Visit (INDEPENDENT_AMBULATORY_CARE_PROVIDER_SITE_OTHER): Payer: Self-pay | Admitting: Physician Assistant

## 2022-03-03 ENCOUNTER — Encounter: Payer: Self-pay | Admitting: Physician Assistant

## 2022-03-03 VITALS — BP 121/80 | HR 61 | Ht 67.05 in | Wt 195.6 lb

## 2022-03-03 DIAGNOSIS — I493 Ventricular premature depolarization: Secondary | ICD-10-CM

## 2022-03-03 DIAGNOSIS — I10 Essential (primary) hypertension: Secondary | ICD-10-CM

## 2022-03-03 DIAGNOSIS — Z01810 Encounter for preprocedural cardiovascular examination: Secondary | ICD-10-CM

## 2022-03-03 DIAGNOSIS — E119 Type 2 diabetes mellitus without complications: Secondary | ICD-10-CM

## 2022-03-03 DIAGNOSIS — E781 Pure hyperglyceridemia: Secondary | ICD-10-CM

## 2022-03-03 NOTE — Progress Notes (Signed)
Cardiology Office Note:    Date:  03/05/2022   ID:  [name], DOB 04/28/1976, MRN [id number]  PCP:  [name], [name], MD   [facility] Providers Cardiologist:  Peter Martinique, MD     Referring MD: [name], [name], *   Chief Complaint  Patient presents with   Follow-up    Seen for Dr. Martinique    History of Present Illness:    Bernard Baker is a 46 y.o. male with a hx of frequent PVCs, hypertension, hypertriglyceridemia, DM2, OSA, morbid obesity and questionable history of nonsustained VT.  He has a history of bariatric surgery.  Due to prior history of palpitation, a event monitor was obtained in May 2019 that demonstrated sinus rhythm with PVCs and ventricular couplets.  Although previous note indicated nonsustained VT, there was no mention of nonsustained VT in this study.  Echocardiogram obtained on 02/07/2018 showed EF 60 to 65%, mild LVH, no regional wall motion abnormality.  Patient was last seen by Dr. [name] on 05/14/2021 at which time he was complaining of dyspnea on exertion, therefore echocardiogram and stress test was obtained.  It does not appears that echocardiogram was ever performed.  Patient did undergo Myoview on 06/09/2021 which came back low risk, no evidence of infarction or ischemia, EF 54%.  Patient presents today for preoperative clearance.  He says he is going to see a Psychologist, sport and exercise for GU enhancement surgery around male genitalia.  He is not sure the extent of the surgery but suspect he likely will require general anesthesia.  Talking with the patient, he has no problem accomplishing more than 4 METS of activity.  He denied any recent chest pain or worsening shortness of breath.  He has no heart failure symptoms.  Despite history of frequent PVCs, both echocardiogram in 2019 and the stress test in 2021 shows normal ejection fraction.  He does eat chocolate very frequently on a daily basis, I asked him to cut back on caffeinated food including chocolate.   He only drink soda rarely.  He also drinks decaf coffee.  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  Overall, he is doing quite well from the cardiac perspective [place] proceed with GU surgery without additional evaluation.  Past Medical History:  Diagnosis Date   Diabetes mellitus without complication ([place])    Essential hypertension    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   H/O bariatric surgery    History of cardiac murmur 01/10/2018   Hypertension    Hypertriglyceridemia 05/14/2021   Morbid obesity ([place]) 01/10/2018   Non-insulin dependent type 2 diabetes mellitus ([place]) 01/10/2018   Nonsustained ventricular tachycardia ([place])    Obesity    Palpitations    Sleep apnea 01/10/2018   Pt has poor sleep, daytime fatigue, HTN, morbid obesity, and a history of snoring-r/o sleep apnea    Past Surgical History:  Procedure Laterality Date   LAPAROSCOPIC GASTRIC SLEEVE RESECTION  06/23/2020    Current Medications: Current Meds  Medication Sig   lisinopril (PRINIVIL,ZESTRIL) 20 MG tablet Take 20 mg by mouth daily.     Allergies:   Shellfish allergy   Social History   Socioeconomic History   Marital status: Married    Spouse name: Not on file   Number of children: Not on file   Years of education: Not on file   Highest education level: Not on file  Occupational History   Not on file  Tobacco Use   Smoking status: Some Days  Types: Cigarettes    Last attempt to quit: 2015    Years since quitting: 8.4   Smokeless tobacco: Never  Vaping Use   Vaping Use: Former  Substance and Sexual Activity   Alcohol use: No   Drug use: No   Sexual activity: Not on file  Other Topics Concern   Not on file  Social History Narrative   Not on file   Social Determinants of Health   Financial Resource Strain: Not on file  Food Insecurity: Not on file  Transportation Needs: Not on file  Physical Activity: Not on file  Stress: Not on file  Social Connections: Not on file     Family History: The  patient's family history includes Alzheimer's disease in his maternal grandfather; Autism in his daughter; Blindness in his daughter and son; Diabetes in his maternal grandmother and mother; Heart Problems in his father, maternal grandmother, and mother; Hyperlipidemia in his maternal grandmother; Hypertension in his maternal grandmother; Stroke in his father.  ROS:   Please see the history of present illness.     All other systems reviewed and are negative.  EKGs/Labs/Other Studies Reviewed:    The following studies were reviewed today:  Myoview 06/09/2021   The study is normal. The study is low risk.   No ST deviation was noted.   LV perfusion is normal. There is no evidence of ischemia. There is no evidence of infarction.   Left ventricular function is normal. Nuclear stress EF: 54 %. The left ventricular ejection fraction is mildly decreased (45-54%). End diastolic cavity size is normal. End systolic cavity size is normal.   Prior study not available for comparison.   Low risk study, no evidence of ischemia. There is mild inferior artifact due to bowel uptake, which is improved on stress imaging.  EKG:  EKG is ordered today.  The ekg ordered today demonstrates normal sinus rhythm with PVCs  Recent Labs: 03/07/2021: Hemoglobin 14.6; Platelets 277 05/14/2021: BUN 7; Creatinine, Ser 0.95; Magnesium 2.3; Potassium 4.2; Sodium 142  Recent Lipid Panel No results found for: "CHOL", "TRIG", "HDL", "CHOLHDL", "VLDL", "LDLCALC", "LDLDIRECT"   Risk Assessment/Calculations:          Physical Exam:    VS:  BP 121/80   Pulse 61   Ht 5' 7.05" (1.703 m)   Wt 195 lb 9.6 oz (88.7 kg)   SpO2 100%   BMI 30.59 kg/m     Wt Readings from Last 3 Encounters:  03/03/22 195 lb 9.6 oz (88.7 kg)  06/09/21 197 lb (89.4 kg)  06/02/21 197 lb (89.4 kg)     GEN:  Well nourished, well developed in no acute distress HEENT: Normal NECK: No JVD; No carotid bruits LYMPHATICS: No  lymphadenopathy CARDIAC: RRR, no murmurs, rubs, gallops RESPIRATORY:  Clear to auscultation without rales, wheezing or rhonchi  ABDOMEN: Soft, non-tender, non-distended MUSCULOSKELETAL:  No edema; No deformity  SKIN: Warm and dry NEUROLOGIC:  Alert and oriented x 3 PSYCHIATRIC:  Normal affect   ASSESSMENT:    1. Preop cardiovascular exam   2. Essential hypertension   3. Hypertriglyceridemia   4. Controlled type 2 diabetes mellitus without complication, without long-term current use of insulin (HCC)    PLAN:    In order of problems listed above:  Preoperative clearance: We do not have a preop clearance request, per patient, he is going to see a surgeon regarding male enhancement surgery around his genitalia.  He is clearly able to accomplish more than 4 METS  of activity.  From the cardiac perspective, he is at acceptable risk to proceed without further work-up  Hypertension: Blood pressure stable  Hyperlipidemia: Diet and exercise  DM2: Although diabetes is listed under past medical history, however I do not have a hemoglobin A1c to confirm this diagnosis.  He is not on any medication for this.  This will need to be followed by primary care provider  History of PVCs: Seen on previous heart monitor.  There was a previous diagnosis of nonsustained VT, however I did not see any mention of nonsustained VT on the previous heart monitor report to support to this questionable diagnosis.           Medication Adjustments/Labs and Tests Ordered: Current medicines are reviewed at length with the patient today.  Concerns regarding medicines are outlined above.  Orders Placed This Encounter  Procedures   EKG 12-Lead   No orders of the defined types were placed in this encounter.   Patient Instructions  Medication Instructions:  Your physician recommends that you continue on your current medications as directed. Please refer to the Current Medication list given to you today.  *If you  need a refill on your cardiac medications before your next appointment, please call your pharmacy*  Lab Work: NONE ordered at this time of appointment   If you have labs (blood work) drawn today and your tests are completely normal, you will receive your results only by: [place] (if you have MyChart) OR A paper copy in the mail If you have any lab test that is abnormal or we need to change your treatment, we will call you to review the results.  Testing/Procedures: NONE ordered at this time of appointment   Follow-Up: At [facility], you and your health needs are our priority.  As part of our continuing mission to provide you with exceptional heart care, we have created designated Provider Care Teams.  These Care Teams include your primary Cardiologist (physician) and Advanced Practice Providers (APPs -  Physician Assistants and Nurse Practitioners) who all work together to provide you with the care you need, when you need it.   Your next appointment:   1 year(s)  The format for your next appointment:   In Person  Provider:   Peter Martinique, MD     Other Instructions   Important Information About Sugar         Signed, Almyra Deforest, [place]  03/05/2022 11:00 PM    [place]

## 2022-03-03 NOTE — Patient Instructions (Addendum)
Medication Instructions:  Your physician recommends that you continue on your current medications as directed. Please refer to the Current Medication list given to you today.  *If you need a refill on your cardiac medications before your next appointment, please call your pharmacy*  Lab Work: NONE ordered at this time of appointment   If you have labs (blood work) drawn today and your tests are completely normal, you will receive your results only by: MyChart Message (if you have MyChart) OR A paper copy in the mail If you have any lab test that is abnormal or we need to change your treatment, we will call you to review the results.  Testing/Procedures: NONE ordered at this time of appointment   Follow-Up: At [facility], you and your health needs are our priority.  As part of our continuing mission to provide you with exceptional heart care, we have created designated Provider Care Teams.  These Care Teams include your primary Cardiologist (physician) and Advanced Practice Providers (APPs -  Physician Assistants and Nurse Practitioners) who all work together to provide you with the care you need, when you need it.    Your next appointment:   1 year(s)  The format for your next appointment:   In Person  Provider:   Peter Jordan, MD     Other Instructions   Important Information About Sugar       

## 2022-03-05 ENCOUNTER — Encounter: Payer: Self-pay | Admitting: Physician Assistant

## 2022-03-17 ENCOUNTER — Ambulatory Visit (HOSPITAL_COMMUNITY)
Admission: EM | Admit: 2022-03-17 | Discharge: 2022-03-17 | Disposition: A | Discharge status: Home / Self Care | Payer: 59 | Attending: Physician Assistant | Admitting: Physician Assistant

## 2022-03-17 ENCOUNTER — Encounter (HOSPITAL_COMMUNITY): Payer: Self-pay | Admitting: Emergency Medicine

## 2022-03-17 DIAGNOSIS — Z113 Encounter for screening for infections with a predominantly sexual mode of transmission: Secondary | ICD-10-CM | POA: Diagnosis present

## 2022-03-17 DIAGNOSIS — R300 Dysuria: Secondary | ICD-10-CM | POA: Diagnosis not present

## 2022-03-17 DIAGNOSIS — R69 Illness, unspecified: Secondary | ICD-10-CM | POA: Diagnosis not present

## 2022-03-17 LAB — POCT URINALYSIS DIPSTICK, ED / UC
Bilirubin Urine: NEGATIVE
Glucose, UA: NEGATIVE mg/dL
Hgb urine dipstick: NEGATIVE
Ketones, ur: NEGATIVE mg/dL
Leukocytes,Ua: NEGATIVE
Nitrite: NEGATIVE
Protein, ur: NEGATIVE mg/dL
Specific Gravity, Urine: >=1.03 (ref 1.005–1.030)
Urobilinogen, UA: 0.2 mg/dL (ref 0.0–1.0)
pH: 5.5 (ref 5.0–8.0)

## 2022-03-17 NOTE — Discharge Instructions (Signed)
Monitor your MyChart for your results.  We will contact you if anything is positive.  Make sure you are drinking plenty of fluid is concentrated urine can worsen/contribute to symptoms.  If all of your testing is negative and you continue to have symptoms I recommend you follow-up with a specialist (urologist).  Please call to schedule an appointment.  If anything worsens please return for reevaluation.

## 2022-03-17 NOTE — ED Triage Notes (Signed)
Pt is present today with dysuria that started x3 days ago

## 2022-03-17 NOTE — ED Provider Notes (Addendum)
[facility]    CSN: [id number] Arrival date & time: 03/17/22  0802      History   Chief Complaint Chief Complaint  Patient presents with   Dysuria    HPI Bernard Baker is a 46 y.o. male.   Patient presents today with a 3-day history of penile irritation/dysuria.  He reports that his wife has a UTI and he is concerned that this has been passed to him.  He denies any penile discharge, testicular pain, pelvic pain, fever, nausea, vomiting, abdominal pain.  He reports that burning sensation is mild but is present with each episode of micturition.  He denies history of UTI or nephrolithiasis.  He denies any recent urogenital procedure or self-catheterization.  Denies any recent antibiotic use.  Denies history of STI.  He has been married for 20+ years and has no specific concerns but is open to testing.    Past Medical History:  Diagnosis Date   Diabetes mellitus without complication (HCC)    Essential hypertension    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   H/O bariatric surgery    History of cardiac murmur 01/10/2018   Hypertension    Hypertriglyceridemia 05/14/2021   Morbid obesity (HCC) 01/10/2018   Non-insulin dependent type 2 diabetes mellitus (HCC) 01/10/2018   Nonsustained ventricular tachycardia (HCC)    Obesity    Palpitations    Sleep apnea 01/10/2018   Pt has poor sleep, daytime fatigue, HTN, morbid obesity, and a history of snoring-r/o sleep apnea    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   Diabetes mellitus without complication (HCC) 04/28/2021   Hypertension 04/28/2021   Obesity 04/28/2021   H/O bariatric surgery 04/28/2021   Nonsustained ventricular tachycardia (HCC) 04/28/2021   Palpitations 01/10/2018   History of cardiac murmur 01/10/2018   Essential hypertension 01/10/2018   Non-insulin dependent type 2 diabetes mellitus (HCC) 01/10/2018   Morbid obesity (HCC) 01/10/2018   Sleep  apnea 01/10/2018    Past Surgical History:  Procedure Laterality Date   LAPAROSCOPIC GASTRIC SLEEVE RESECTION  06/23/2020       Home Medications    Prior to Admission medications   Medication Sig Start Date End Date Taking? Authorizing Provider  lisinopril (PRINIVIL,ZESTRIL) 20 MG tablet Take 20 mg by mouth daily. 12/08/17   [provider]    Family History Family History  Problem Relation Age of Onset   Diabetes Mother    Heart Problems Mother    Stroke Father    Heart Problems Father    Heart Problems Maternal Grandmother    Diabetes Maternal Grandmother    Hyperlipidemia Maternal Grandmother    Hypertension Maternal Grandmother    Alzheimer's disease Maternal Grandfather    Blindness Son    Blindness Daughter    Autism Daughter     Social History Social History   Tobacco Use   Smoking status: Some Days    Types: Cigarettes    Last attempt to quit: 2015    Years since quitting: 8.4   Smokeless tobacco: Never  Vaping Use   Vaping Use: Former  Substance Use Topics   Alcohol use: No   Drug use: No     Allergies   Shellfish allergy   Review of Systems Review of Systems  Constitutional:  Negative for activity change, appetite change, fatigue and fever.  Gastrointestinal:  Negative for abdominal pain, diarrhea, nausea and vomiting.  Genitourinary:  Positive for dysuria. Negative for  frequency, genital sores, penile discharge, penile pain, penile swelling, testicular pain and urgency.     Physical Exam Triage Vital Signs ED Triage Vitals  Enc Vitals Group     BP 03/17/22 0818 132/82     Pulse Rate 03/17/22 0818 71     Resp 03/17/22 0818 18     Temp 03/17/22 0818 98.7 F (37.1 C)     Temp src --      SpO2 03/17/22 0818 98 %     Weight --      Height --      Head Circumference --      Peak Flow --      Pain Score 03/17/22 0817 0     Pain Loc --      Pain Edu? --      Excl. in [place]? --    No data found.  Updated Vital Signs BP  132/82   Pulse 71   Temp 98.7 F (37.1 C)   Resp 18   SpO2 98%   Visual Acuity Right Eye Distance:   Left Eye Distance:   Bilateral Distance:    Right Eye Near:   Left Eye Near:    Bilateral Near:     Physical Exam Vitals reviewed.  Constitutional:      General: He is awake.     Appearance: Normal appearance. He is well-developed. He is not ill-appearing.     Comments: Very pleasant male appears stated age in no acute distress sitting comfortably in exam room  HENT:     Head: Normocephalic and atraumatic.  Cardiovascular:     Rate and Rhythm: Normal rate and regular rhythm.     Heart sounds: Normal heart sounds, S1 normal and S2 normal. No murmur heard. Pulmonary:     Effort: Pulmonary effort is normal.     Breath sounds: Normal breath sounds. No stridor. No wheezing, rhonchi or rales.     Comments: Clear to auscultation bilaterally Abdominal:     General: Bowel sounds are normal.     Palpations: Abdomen is soft.     Tenderness: There is no abdominal tenderness. There is no right CVA tenderness, left CVA tenderness, guarding or rebound.     Comments: Benign abdominal exam  Genitourinary:    Comments: Exam deferred Neurological:     Mental Status: He is alert.  Psychiatric:        Behavior: Behavior is cooperative.      UC Treatments / Results  Labs (all labs ordered are listed, but only abnormal results are displayed) Labs Reviewed  URINE CULTURE  HIV ANTIBODY (ROUTINE TESTING W REFLEX)  RPR  HEPATITIS C ANTIBODY  POCT URINALYSIS DIPSTICK, ED / UC  CYTOLOGY, (ORAL, ANAL, URETHRAL) ANCILLARY ONLY    EKG   Radiology No results found.  Procedures Procedures (including critical care time)  Medications Ordered in UC Medications - No data to display  Initial Impression / Assessment and Plan / UC Course  I have reviewed the triage vital signs and the nursing notes.  Pertinent labs & imaging results that were available during my care of the patient  were reviewed by me and considered in my medical decision making (see chart for details).     UA was concentrated but was otherwise normal.  Discussed that this raises the suspicion for STI.  Patient is open to STI testing.  We will also send off UA for culture to ensure infection is not contributing to symptoms.  Discussed that significantly  concentrated urine can sometimes cause discomfort and so he should push fluids to ensure that is not contributing.  He is to monitor his MyChart for results we will contact him with any positive results that require treatment.  Discussed that if symptoms or not improving and all of his testing is negative he should follow-up with urologist.  He was given contact information for a local provider with instruction to call to schedule an appointment.  If he has any worsening symptoms including discharge, abdominal pain, fever, nausea, vomiting he should be seen immediately.  Strict return precautions given.  Final Clinical Impressions(s) / UC Diagnoses   Final diagnoses:  Dysuria  Routine screening for STI (sexually transmitted infection)     Discharge Instructions      Monitor your MyChart for your results.  We will contact you if anything is positive.  Make sure you are drinking plenty of fluid is concentrated urine can worsen/contribute to symptoms.  If all of your testing is negative and you continue to have symptoms I recommend you follow-up with a specialist (urologist).  Please call to schedule an appointment.  If anything worsens please return for reevaluation.     ED Prescriptions   None    PDMP not reviewed this encounter.   Jeani Hawking, PA-C 03/17/22 0844    Jalin Alicea, Noberto Retort, PA-C 03/17/22 [phone]

## 2022-03-18 LAB — URINE CULTURE: Culture: NO GROWTH

## 2022-03-18 LAB — CYTOLOGY, (ORAL, ANAL, URETHRAL) ANCILLARY ONLY
Chlamydia: NEGATIVE
Comment: NEGATIVE
Comment: NEGATIVE
Comment: NORMAL
Neisseria Gonorrhea: NEGATIVE
Trichomonas: NEGATIVE

## 2022-03-22 ENCOUNTER — Telehealth: Payer: Self-pay | Admitting: Physical Medicine and Rehabilitation

## 2022-03-22 NOTE — Telephone Encounter (Signed)
Patient called needing to schedule an appointment with Dr. Alvester Morin for an injection in his back.  The number to contact patient is [phone]

## 2022-03-31 ENCOUNTER — Ambulatory Visit: Payer: 59 | Admitting: Physical Medicine and Rehabilitation

## 2022-03-31 ENCOUNTER — Encounter: Payer: Self-pay | Admitting: Physical Medicine and Rehabilitation

## 2022-03-31 DIAGNOSIS — G8929 Other chronic pain: Secondary | ICD-10-CM

## 2022-03-31 DIAGNOSIS — M546 Pain in thoracic spine: Secondary | ICD-10-CM | POA: Diagnosis not present

## 2022-03-31 DIAGNOSIS — M7918 Myalgia, other site: Secondary | ICD-10-CM | POA: Diagnosis not present

## 2022-03-31 MED ORDER — METHOCARBAMOL 500 MG PO TABS: 500.0000 mg | ORAL_TABLET | Freq: Three times a day (TID) | ORAL | 0 refills | Status: DC

## 2022-03-31 NOTE — Progress Notes (Signed)
Bernard Baker - 46 y.o. male MRN [id number]  Date of birth: [birth year]  Office Visit Note: Visit Date: 03/31/2022 PCP: Lezlie Lye, Meda Coffee, MD Referred by: Lezlie Lye, Meda Coffee, *  Subjective: No chief complaint on file.  HPI: Bernard Baker is a 46 y.o. male who comes in today as a self referral for evaluation of chronic, worsening and severe left sided thoracic back pain. We are currently treating his wife [name] in our office. Patient reports pain has been ongoing intermittently since 2021 and is exacerbated by movement and activity. He describes pain as sore, aching and tight in nature, currently rates as 8 out of 10. Patient reports some relief of pain with home exercise regimen, rest and use of aspirin. Patient states he also wears back brace most days that does seem to help ease pain. Patient has attended formal chiropractic treatments in the past at [facility] and reports no relief of pain with these treatments. Patient states he also received steroid muscle injections at his primary care office in the past, no relief of pain with this therapy. 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 No history of thoracic imaging/surgery. Patient denies focal weakness, numbness and tingling. Patient denies recent trauma or injury.   Patients course is complicated by non sustained ventricular tachycardia, PVC's and diabetes mellitus. He does have history of bariatric surgery in 2021.    Review of Systems  Musculoskeletal:  Positive for back pain and myalgias.  Neurological:  Negative for tingling, sensory change, focal weakness and weakness.  All other systems reviewed and are negative.  Otherwise per HPI.  Assessment & Plan: Visit Diagnoses:    ICD-10-CM   1. Chronic left-sided thoracic back pain  M54.6 Ambulatory referral to Physical Therapy   G89.29     2. Myofascial pain  syndrome  M79.18 Ambulatory referral to Physical Therapy       Plan: Findings:  Chronic, worsening and severe left sided thoracic back pain. Patient continues to have severe pain despite good conservative therapies such as home exercise regimen, rest and use of medications. Patients clinical presentation and exam are consistent with myofascial pain. Patient has multiple palpable trigger points noted to left thoracic paraspinal region. He also has significant tenderness to this region upon palpation. I believe the next step is to place referral to our in house physical therapy team with a focus on manual treatments and dry needling. I also placed a prescription for Robaxin today, patient instructed to try this medication at night for the first week, if needed he can take 3 times a day. I also instructed patient to take Tylenol 500 mg up to 3 times a day as needed. I would like to see patient back in approximately 6 weeks for re-evaluation. If his pain persists I would consider obtaining imaging of thoracic spine. Patient encouraged to remain active and to continue with home exercise regimen as tolerated. No red flag symptoms noted upon exam today.     Meds & Orders:  Meds ordered this encounter  Medications   methocarbamol (ROBAXIN) 500 MG tablet    Sig: Take 1 tablet (500 mg total) by mouth 3 (three) times daily.    Dispense:  90 tablet    Refill:  0    Order Specific Question:   Supervising Provider    Answer:   Tyrell Antonio [[id number]]    Orders Placed This  Encounter  Procedures   Ambulatory referral to Physical Therapy    Follow-up: Return for 6 week follow up for re-evaluation.   Procedures: No procedures performed      Clinical History: No specialty comments available.   He reports that he has been smoking cigarettes. He has never used smokeless tobacco. No results for input(s): "HGBA1C", "LABURIC" in the last 8760 hours.  Objective:  VS:  HT:    WT:   BMI:     BP:   HR: bpm   TEMP: ( )  RESP:  Physical Exam Vitals and nursing note reviewed.  HENT:     Head: Normocephalic and atraumatic.     Right Ear: External ear normal.     Left Ear: External ear normal.     Nose: Nose normal.     Mouth/Throat:     Mouth: Mucous membranes are moist.  Eyes:     Extraocular Movements: Extraocular movements intact.  Cardiovascular:     Rate and Rhythm: Normal rate.     Pulses: Normal pulses.  Pulmonary:     Effort: Pulmonary effort is normal.  Abdominal:     General: Abdomen is flat. There is no distension.  Musculoskeletal:        General: Tenderness present.     Cervical back: Normal range of motion.     Comments: Pt rises from seated position to standing without difficulty. Strong distal strength without clonus, no pain upon palpation of greater trochanters. Sensation intact bilaterally. Multiple palpable trigger points noted to left thoracic paraspinal region. Walks independently, gait steady.   Skin:    General: Skin is warm and dry.     Capillary Refill: Capillary refill takes less than 2 seconds.  Neurological:     General: No focal deficit present.     Mental Status: He is alert and oriented to person, place, and time.  Psychiatric:        Mood and Affect: Mood normal.        Behavior: Behavior normal.     Ortho Exam  Imaging: No results found.  Past Medical/Family/Surgical/Social History: Medications & Allergies reviewed per EMR, new medications updated. 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   Diabetes mellitus without complication (HCC) 04/28/2021   Hypertension 04/28/2021   Obesity 04/28/2021   H/O bariatric surgery 04/28/2021   Nonsustained ventricular tachycardia (HCC) 04/28/2021   Palpitations 01/10/2018   History of cardiac murmur 01/10/2018   Essential hypertension 01/10/2018   Non-insulin dependent type 2 diabetes mellitus (HCC) 01/10/2018   Morbid obesity  (HCC) 01/10/2018   Sleep apnea 01/10/2018   Past Medical History:  Diagnosis Date   Diabetes mellitus without complication (HCC)    Essential hypertension    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   H/O bariatric surgery    History of cardiac murmur 01/10/2018   Hypertension    Hypertriglyceridemia 05/14/2021   Morbid obesity (HCC) 01/10/2018   Non-insulin dependent type 2 diabetes mellitus (HCC) 01/10/2018   Nonsustained ventricular tachycardia (HCC)    Obesity    Palpitations    Sleep apnea 01/10/2018   Pt has poor sleep, daytime fatigue, HTN, morbid obesity, and a history of snoring-r/o sleep apnea   Family History  Problem Relation Age of Onset   Diabetes Mother    Heart Problems Mother    Stroke Father    Heart Problems Father    Heart Problems Maternal Grandmother    Diabetes Maternal  Grandmother    Hyperlipidemia Maternal Grandmother    Hypertension Maternal Grandmother    Alzheimer's disease Maternal Grandfather    Blindness Son    Blindness Daughter    Autism Daughter    Past Surgical History:  Procedure Laterality Date   LAPAROSCOPIC GASTRIC SLEEVE RESECTION  06/23/2020   Social History   Occupational History   Not on file  Tobacco Use   Smoking status: Some Days    Types: Cigarettes    Last attempt to quit: 2015    Years since quitting: 8.5   Smokeless tobacco: Never  Vaping Use   Vaping Use: Former  Substance and Sexual Activity   Alcohol use: No   Drug use: No   Sexual activity: Not on file

## 2022-04-21 ENCOUNTER — Ambulatory Visit (INDEPENDENT_AMBULATORY_CARE_PROVIDER_SITE_OTHER): Payer: 59 | Admitting: Physical Therapy

## 2022-04-21 ENCOUNTER — Encounter: Payer: Self-pay | Admitting: Physical Therapy

## 2022-04-21 DIAGNOSIS — M546 Pain in thoracic spine: Secondary | ICD-10-CM | POA: Diagnosis not present

## 2022-04-21 DIAGNOSIS — R252 Cramp and spasm: Secondary | ICD-10-CM

## 2022-04-21 DIAGNOSIS — M5459 Other low back pain: Secondary | ICD-10-CM

## 2022-04-21 DIAGNOSIS — M6281 Muscle weakness (generalized): Secondary | ICD-10-CM | POA: Diagnosis not present

## 2022-04-21 DIAGNOSIS — R293 Abnormal posture: Secondary | ICD-10-CM | POA: Diagnosis not present
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
## 2022-04-27 ENCOUNTER — Encounter: Payer: Self-pay | Admitting: Physical Therapy

## 2022-04-27 ENCOUNTER — Ambulatory Visit (INDEPENDENT_AMBULATORY_CARE_PROVIDER_SITE_OTHER): Payer: 59 | Admitting: Physical Therapy

## 2022-04-27 DIAGNOSIS — R293 Abnormal posture: Secondary | ICD-10-CM

## 2022-04-27 DIAGNOSIS — M5459 Other low back pain: Secondary | ICD-10-CM

## 2022-04-27 DIAGNOSIS — R252 Cramp and spasm: Secondary | ICD-10-CM

## 2022-04-27 DIAGNOSIS — M6281 Muscle weakness (generalized): Secondary | ICD-10-CM | POA: Diagnosis not present

## 2022-04-27 DIAGNOSIS — M546 Pain in thoracic spine: Secondary | ICD-10-CM | POA: Diagnosis not present

## 2022-04-27 NOTE — Therapy (Signed)
OUTPATIENT PHYSICAL THERAPY TREATMENT NOTE   Patient Name: Bernard Baker MRN: [id number] DOB:10/16/1975, 46 y.o., male Today's Date: 04/27/2022    END OF SESSION:   PT End of Session - 04/27/22 1140     Visit Number 2    Number of Visits 13    Date for PT Re-Evaluation 06/02/22    Authorization Type Aetna/[employer]S    Authorization Time Period 04/21/22 to 06/02/22    PT Start Time 1140    PT Stop Time 1218    PT Time Calculation (min) 38 min    Activity Tolerance Patient tolerated treatment well    Behavior During Therapy [facility] for tasks assessed/performed             Past Medical History:  Diagnosis Date   Diabetes mellitus without complication (HCC)    Essential hypertension    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   H/O bariatric surgery    History of cardiac murmur 01/10/2018   Hypertension    Hypertriglyceridemia 05/14/2021   Morbid obesity (HCC) 01/10/2018   Non-insulin dependent type 2 diabetes mellitus (HCC) 01/10/2018   Nonsustained ventricular tachycardia (HCC)    Obesity    Palpitations    Sleep apnea 01/10/2018   Pt has poor sleep, daytime fatigue, HTN, morbid obesity, and a history of snoring-r/o sleep apnea   Past Surgical History:  Procedure Laterality Date   LAPAROSCOPIC GASTRIC SLEEVE RESECTION  06/23/2020   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   Diabetes mellitus without complication (HCC) 04/28/2021   Hypertension 04/28/2021   Obesity 04/28/2021   H/O bariatric surgery 04/28/2021   Nonsustained ventricular tachycardia (HCC) 04/28/2021   Palpitations 01/10/2018   History of cardiac murmur 01/10/2018   Essential hypertension 01/10/2018   Non-insulin dependent type 2 diabetes mellitus (HCC) 01/10/2018   Morbid obesity (HCC) 01/10/2018   Sleep apnea 01/10/2018   THERAPY DIAG:  Pain in thoracic spine  Other low back pain  Cramp and spasm  Muscle weakness (generalized)  Abnormal  posture  PCP: Lezlie Lye, [place]    REFERRING PROVIDER: Juanda Chance, NP    REFERRING DIAG: [phone] (ICD-10-CM) - Chronic left-sided thoracic back pain M79.18 (ICD-10-CM) - Myofascial pain syndrome    Rationale for Evaluation and Treatment Rehabilitation   ONSET DATE: 03/31/2022    SUBJECTIVE:                                                                                                                                                                                            SUBJECTIVE STATEMENT: Doing well; c/o discomfort today  PERTINENT HISTORY:  Bernard Baker is a 46 y.o. male who comes in today as a self referral for evaluation of chronic, worsening and severe left sided thoracic back pain. We are currently treating his wife Bernard Baker in our office. Patient reports pain has been ongoing intermittently since 2021 and is exacerbated by movement and activity. He describes pain as sore, aching and tight in nature, currently rates as 8 out of 10. Patient reports some relief of pain with home exercise regimen, rest and use of aspirin. Patient states he also wears back brace most days that does seem to help ease pain. Patient has attended formal chiropractic treatments in the past at [facility] and reports no relief of pain with these treatments. Patient states he also received steroid muscle injections at his primary care office in the past, no relief of pain with this therapy. 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 No history of thoracic imaging/surgery. Patient denies focal weakness, numbness and tingling. Patient denies recent trauma or injury.      PAIN:  Are you having pain? Yes: NPRS scale: 3-4/10 Pain location: midback but more on the left side  Pain description: hard to describe, like a strong tightening  Aggravating factors: doing a lot of physical  work/activities  Relieving factors: massages     PRECAUTIONS: None   WEIGHT BEARING RESTRICTIONS No   PATIENT GOALS no pain      OBJECTIVE:    PATIENT SURVEYS:  Eval: FOTO 46.5   MUSCLE LENGTH: Hamstrings moderate limitation B, piriformis severe limitation B, quads mild limitation B, hip flexors moderate limitation B  Slight leg length discrepancy with L LE shorter than R by about 1/4 inch    POSTURE: rounded shoulders, forward head, increased lumbar lordosis, increased thoracic kyphosis, and flexed trunk    PALPATION: Thoracic mm spasms L>R, interestingly did not note significant mm spasms or guarding in lumbar region; no significant mm spasms or trigger points noted glutes/piriformis B    LUMBAR ROM:    Active  A/PROM  eval  Flexion Moderate limitation, RFIS increases pain   Extension Mild limitation, REIS increases pain    Right lateral flexion Mild limitation   Left lateral flexion Mild limitation   Right rotation Moderate limitation   Left rotation Moderate limitation   (Blank rows = not tested)   Thoracic flexion mild limitation  Thoracic extension moderate limitation, increased lumbar pain  Thoracic lateral flexion WNL B Thoracic rotation WNL B     LOWER EXTREMITY MMT:     MMT Right eval Left eval  Hip flexion 4+ 4+  Hip extension 4+ 4+  Hip abduction 4+ 4+  Hip adduction      Hip internal rotation      Hip external rotation      Knee flexion 4 4+  Knee extension 4+ 4+  Ankle dorsiflexion 5 5  Ankle plantarflexion      Ankle inversion      Ankle eversion       (Blank rows = not tested)   LUMBAR SPECIAL TESTS:  Straight leg raise test: Positive L LE>R LE          TODAY'S TREATMENT  04/27/22 Therex NuStep L4 x 8 min Single knee to chest 5x5 sec bil Lower trunk rotation 5x5 sec bil Supine piriformis stretch 2x20 sec bil; figure-4 stretch Supine hamstring stretch 2x20 sec bil; with strap Demonstrated  seated mid back stretch and self  mobilization with tennis ball   Manual: STM with compression to thoracic paraspinals; skilled palpation and monitoring of soft tissue during DN  Trigger Point Dry-Needling  Treatment instructions: Expect mild to moderate muscle soreness. S/S of pneumothorax if dry needled over a lung field, and to seek immediate medical attention should they occur. Patient verbalized understanding of these instructions and education.  Patient Consent Given: Yes Education handout provided: Yes Muscles treated: Lt thoracic paraspinals Electrical stimulation performed: No Parameters: N/A Treatment response/outcome: twitch responses noted; reported soreness after   04/21/22 SKTC 5x5 second holds B  Lumbar rotation stretches 5x5 B HS stretches 3x30 seconds B  Figure 4 piriformis strength 3x30 seconds B      PATIENT EDUCATION:  Education details: DN Person educated: Patient Education method: Medical illustrator, Handout Education comprehension: verbalized understanding     HOME EXERCISE PROGRAM: Access Code: [email or web address] URL: [email or web address] Date: 04/27/2022 Prepared by: Moshe Cipro  Exercises - Supine Single Knee to Chest Stretch  - 1-2 x daily - 7 x weekly - 1 sets - 10 reps - 5 hold - Supine Lower Trunk Rotation  - 1-2 x daily - 7 x weekly - 1 sets - 10 reps - 5 hold - Supine Figure 4 Piriformis Stretch  - 1-2 x daily - 7 x weekly - 1 sets - 3 reps - 30 hold - Supine Hamstring Stretch with Strap  - 1-2 x daily - 7 x weekly - 1 sets - 3 reps - 30 hold   ASSESSMENT:   CLINICAL IMPRESSION: Session today focused on review of HEP as well as manual and TPDN to Lt thoracic paraspinals.  Twitch response noted with DN today.   Needed min cues for HEP review at this time.  Will continue to benefit from PT to maximize function.   OBJECTIVE IMPAIRMENTS decreased mobility, difficulty walking, decreased ROM, decreased strength, hypomobility, increased fascial  restrictions, increased muscle spasms, impaired flexibility, improper body mechanics, postural dysfunction, and pain.    ACTIVITY LIMITATIONS carrying, lifting, bending, sitting, standing, squatting, sleeping, stairs, transfers, bed mobility, bathing, and locomotion level   PARTICIPATION LIMITATIONS: shopping, community activity, occupation, and yard work   PERSONAL FACTORS Age, Behavior pattern, Education, Fitness, Past/current experiences, Social background, and Time since onset of injury/illness/exacerbation are also affecting patient's functional outcome.    REHAB POTENTIAL: Good   CLINICAL DECISION MAKING: Stable/uncomplicated   EVALUATION COMPLEXITY: Low     GOALS: Goals reviewed with patient? Yes   SHORT TERM GOALS: Target date: 05/12/2022   Will be compliant with appropriate progressive HEP  Baseline: Goal status: INITIAL   2.  Pain to be no more than 5/10 at worst at work  Baseline:  Goal status: INITIAL   3.  Will have improved awareness of posture and ergonomics  Baseline:  Goal status: INITIAL   4.  Hamstring and piriformis flexibility to have improved by at least 50% Baseline:  Goal status: INITIAL     LONG TERM GOALS: Target date: 06/02/2022   MMT to be 5/5 globally and will be able to hold standard elbow plank for 30 seconds to demonstrate improved core strength  Baseline:  Goal status: INITIAL   2.  Will demonstrate correct functional lifting mechanics from ground to waist height to avoid aggravating pain  Baseline:  Goal status: INITIAL   3.  Pain to be no more than 2/10 at worst  Baseline:  Goal status: INITIAL   4.  FOTO score to  improve by at least 10 points to show improvement in functional status  Baseline:  Goal status: INITIAL         PLAN: PT FREQUENCY: 2x/week   PT DURATION: 6 weeks   PLANNED INTERVENTIONS: Therapeutic exercises, Therapeutic activity, Neuromuscular re-education, Balance training, Gait training, Patient/Family  education, Self Care, Joint mobilization, Orthotic/Fit training, Dry Needling, Electrical stimulation, Spinal mobilization, Cryotherapy, Moist heat, Taping, Ultrasound, Ionotophoresis 4mg /ml Dexamethasone, Manual therapy, and Re-evaluation.   PLAN FOR NEXT SESSION: assess response to DN, postural training/core strength, lumbar and thoracic mobility, lifting mechanics      , PT, DPT 04/27/22 12:30 PM

## 2022-04-29 ENCOUNTER — Encounter: Payer: 59 | Admitting: Physical Therapy

## 2022-05-03 ENCOUNTER — Ambulatory Visit (INDEPENDENT_AMBULATORY_CARE_PROVIDER_SITE_OTHER): Payer: 59 | Admitting: Rehabilitative and Restorative Service Providers"

## 2022-05-03 ENCOUNTER — Encounter: Payer: Self-pay | Admitting: Rehabilitative and Restorative Service Providers"

## 2022-05-03 DIAGNOSIS — R252 Cramp and spasm: Secondary | ICD-10-CM | POA: Diagnosis not present

## 2022-05-03 DIAGNOSIS — R293 Abnormal posture: Secondary | ICD-10-CM

## 2022-05-03 DIAGNOSIS — M6281 Muscle weakness (generalized): Secondary | ICD-10-CM | POA: Diagnosis not present

## 2022-05-03 DIAGNOSIS — M5459 Other low back pain: Secondary | ICD-10-CM | POA: Diagnosis not present

## 2022-05-03 DIAGNOSIS — M546 Pain in thoracic spine: Secondary | ICD-10-CM

## 2022-05-03 NOTE — Therapy (Addendum)
OUTPATIENT PHYSICAL THERAPY TREATMENT NOTE DISCHARGE SUMMARY   Patient Name: Bernard Baker MRN: [id number] DOB:15-Feb-1976, 46 y.o., male Today's Date: 05/03/2022    END OF SESSION:   PT End of Session - 05/03/22 0937     Visit Number 3    Number of Visits 13    Date for PT Re-Evaluation 06/02/22    Authorization Type Aetna/CVS    Authorization Time Period 04/21/22 to 06/02/22    PT Start Time 0923    PT Stop Time 1002    PT Time Calculation (min) 39 min    Activity Tolerance Patient tolerated treatment well    Behavior During Therapy [facility] for tasks assessed/performed              Past Medical History:  Diagnosis Date   Diabetes mellitus without complication ([place])    Essential hypertension    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   H/O bariatric surgery    History of cardiac murmur 01/10/2018   Hypertension    Hypertriglyceridemia 05/14/2021   Morbid obesity ([place]) 01/10/2018   Non-insulin dependent type 2 diabetes mellitus ([place]) 01/10/2018   Nonsustained ventricular tachycardia ([place])    Obesity    Palpitations    Sleep apnea 01/10/2018   Pt has poor sleep, daytime fatigue, HTN, morbid obesity, and a history of snoring-r/o sleep apnea   Past Surgical History:  Procedure Laterality Date   [place] RESECTION  06/23/2020   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   Diabetes mellitus without complication ([place]) 51/10/5850   Hypertension 04/28/2021   Obesity 04/28/2021   H/O bariatric surgery 04/28/2021   Nonsustained ventricular tachycardia ([place]) 04/28/2021   Palpitations 01/10/2018   History of cardiac murmur 01/10/2018   Essential hypertension 01/10/2018   Non-insulin dependent type 2 diabetes mellitus ([place]) 01/10/2018   Morbid obesity ([place]) 01/10/2018   Sleep apnea 01/10/2018   THERAPY DIAG:  Pain in thoracic spine  Other low back pain  Cramp and spasm  Muscle weakness  (generalized)  Abnormal posture  PCP: [name], [place]    REFERRING PROVIDER: Lorine Bears, NP    REFERRING DIAG: [phone] (ICD-10-CM) - Chronic left-sided thoracic back pain M79.18 (ICD-10-CM) - Myofascial pain syndrome    Rationale for Evaluation and Treatment Rehabilitation   ONSET DATE: 03/31/2022    SUBJECTIVE:                                                                                                                                                                                            SUBJECTIVE STATEMENT: Pt indicated feeling  soreness for several days after last visit with the needling.  Pt indicated he worked a lot this weekend and didn't have severe pain.     PERTINENT HISTORY:  Bernard Baker is a 46 y.o. male who comes in today as a self referral for evaluation of chronic, worsening and severe left sided thoracic back pain. We are currently treating his wife Angelena Sole in our office. Patient reports pain has been ongoing intermittently since 2021 and is exacerbated by movement and activity. He describes pain as sore, aching and tight in nature, currently rates as 8 out of 10. Patient reports some relief of pain with home exercise regimen, rest and use of aspirin. Patient states he also wears back brace most days that does seem to help ease pain. Patient has attended formal chiropractic treatments in the past at [facility] and reports no relief of pain with these treatments. Patient states he also received steroid muscle injections at his primary care office in the past, no relief of pain with this therapy. 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 No history of thoracic imaging/surgery. Patient denies focal weakness, numbness and tingling. Patient denies recent trauma or injury.      PAIN:  NPRS scale: at worst this weekend : 5/10 Pain location: mid  back but more on the left side  Pain description: hard to describe, like a strong tightening  Aggravating factors: doing a lot of physical work/activities  Relieving factors: massages     PRECAUTIONS: None   WEIGHT BEARING RESTRICTIONS No   PATIENT GOALS no pain      OBJECTIVE:    PATIENT SURVEYS:  04/21/2022:  Eval: FOTO 46.5   MUSCLE LENGTH: 04/21/2022 Hamstrings moderate limitation B, piriformis severe limitation B, quads mild limitation B, hip flexors moderate limitation B  Slight leg length discrepancy with L LE shorter than R by about 1/4 inch    POSTURE:  04/21/2022 rounded shoulders, forward head, increased lumbar lordosis, increased thoracic kyphosis, and flexed trunk    PALPATION: 04/21/2022 Thoracic mm spasms L>R, interestingly did not note significant mm spasms or guarding in lumbar region; no significant mm spasms or trigger points noted glutes/piriformis B    LUMBAR ROM:    Active  AROM  04/21/2022 AROM 05/03/2022  Flexion Moderate limitation, RFIS increases pain    Extension Mild limitation, REIS increases pain   75% c ERP lumbar  Repeated x5 in standing:  improved to 100% WFL, reduced low back complaints.   Right lateral flexion Mild limitation    Left lateral flexion Mild limitation    Right rotation Moderate limitation    Left rotation Moderate limitation    (Blank rows = not tested)   04/21/2022 Thoracic flexion mild limitation  Thoracic extension moderate limitation, increased lumbar pain  Thoracic lateral flexion WNL B Thoracic rotation WNL B     LOWER EXTREMITY MMT:     MMT Right 04/21/2022 Left 04/21/2022  Hip flexion 4+ 4+  Hip extension 4+ 4+  Hip abduction 4+ 4+  Hip adduction      Hip internal rotation      Hip external rotation      Knee flexion 4 4+  Knee extension 4+ 4+  Ankle dorsiflexion 5 5  Ankle plantarflexion      Ankle inversion      Ankle eversion       (Blank rows = not tested)  LUMBAR SPECIAL TESTS:   04/21/2022 Straight leg raise test: Positive Lt LE>Rt LE      TODAY'S TREATMENT  05/03/22 Therex NuStep L5 x 10 min Prone opposite arm/leg lift 2-3 sec hold x 10 bilateral ( cues for home use) Single knee to chest 3 x 15 sec bilateral Lower trunk rotation 5x5 sec bil Supine bridge 2-3 sec hold x 15 (cues for home use) Standing lumbar extension x 5  (cues for home use)   Manual: STM with compression to thoracic paraspinals; skilled palpation and monitoring of soft tissue during DN  Trigger Point Dry-Needling  Treatment instructions: Expect mild to moderate muscle soreness. S/S of pneumothorax if dry needled over a lung field, and to seek immediate medical attention should they occur. Patient verbalized understanding of these instructions and education.  Patient Consent Given: Yes Education handout provided: previously Muscles treated: Rt lumbar paraspinals L1-L2 Electrical stimulation performed: No Parameters: N/A Treatment response/outcome: twitch responses noted; reported soreness after  04/27/22 Therex NuStep L4 x 8 min Single knee to chest 5x5 sec bil Lower trunk rotation 5x5 sec bil Supine piriformis stretch 2x20 sec bil; figure-4 stretch Supine hamstring stretch 2x20 sec bil; with strap Demonstrated seated mid back stretch and self mobilization with tennis ball   Manual: STM with compression to thoracic paraspinals; skilled palpation and monitoring of soft tissue during DN  Trigger Point Dry-Needling  Treatment instructions: Expect mild to moderate muscle soreness. S/S of pneumothorax if dry needled over a lung field, and to seek immediate medical attention should they occur. Patient verbalized understanding of these instructions and education.  Patient Consent Given: Yes Education handout provided: Yes Muscles treated: Lt thoracic paraspinals Electrical stimulation performed: No Parameters: N/A Treatment response/outcome: twitch responses noted; reported soreness  after   04/21/22 SKTC 5x5 second holds B  Lumbar rotation stretches 5x5 B HS stretches 3x30 seconds B  Figure 4 piriformis strength 3x30 seconds B      PATIENT EDUCATION:  Education details: HEP Person educated: Patient Education method: Customer service manager, Handout Education comprehension: verbalized understanding     HOME EXERCISE PROGRAM: Access Code: [email or web address] URL: [email or web address] Date: 05/03/2022 Prepared by: Scot Jun  Exercises - Supine Single Knee to Chest Stretch  - 1-2 x daily - 7 x weekly - 1 sets - 10 reps - 15 hold - Supine Lower Trunk Rotation  - 1-2 x daily - 7 x weekly - 1 sets - 10 reps - 15 hold - Supine Figure 4 Piriformis Stretch  - 1-2 x daily - 7 x weekly - 1 sets - 3 reps - 30 hold - Supine Hamstring Stretch with Strap  - 1-2 x daily - 7 x weekly - 1 sets - 3 reps - 30 hold - Prone Alternating Arm and Leg Lifts  - 1-2 x daily - 7 x weekly - 1-2 sets - 10 reps - 3 hold - Supine Bridge  - 1-2 x daily - 7 x weekly - 1-2 sets - 10 reps - 2 hold   ASSESSMENT:   CLINICAL IMPRESSION: Severity of symptoms from weekend reported lower than previously noted.  Continued complaints of thoracic/lumbar pain noted c increased physical activity.  Fair tolerance at best to dry needling option but seeming to have helped lower severity of symptoms.  Continued skilled PT services indicated at this time.    OBJECTIVE IMPAIRMENTS decreased mobility, difficulty walking, decreased ROM, decreased strength, hypomobility, increased fascial restrictions, increased muscle spasms, impaired flexibility, improper body mechanics, postural dysfunction, and pain.  ACTIVITY LIMITATIONS carrying, lifting, bending, sitting, standing, squatting, sleeping, stairs, transfers, bed mobility, bathing, and locomotion level   PARTICIPATION LIMITATIONS: shopping, community activity, occupation, and yard work   PERSONAL FACTORS Age, Behavior pattern, Education,  Fitness, Past/current experiences, Social background, and Time since onset of injury/illness/exacerbation are also affecting patient's functional outcome.    REHAB POTENTIAL: Good   CLINICAL DECISION MAKING: Stable/uncomplicated   EVALUATION COMPLEXITY: Low     GOALS: Goals reviewed with patient? Yes   SHORT TERM GOALS: Target date: 05/12/2022   Will be compliant with appropriate progressive HEP  Baseline: Goal status: on going - assessed 05/03/2022   2.  Pain to be no more than 5/10 at worst at work  Baseline:  Goal status: on going - assessed 05/03/2022   3.  Will have improved awareness of posture and ergonomics  Baseline:  Goal status: on going - assessed 05/03/2022   4.  Hamstring and piriformis flexibility to have improved by at least 50% Baseline:  Goal status: on going - assessed 05/03/2022     LONG TERM GOALS: Target date: 06/02/2022   MMT to be 5/5 globally and will be able to hold standard elbow plank for 30 seconds to demonstrate improved core strength  Baseline:  Goal status: INITIAL   2.  Will demonstrate correct functional lifting mechanics from ground to waist height to avoid aggravating pain  Baseline:  Goal status: INITIAL   3.  Pain to be no more than 2/10 at worst  Baseline:  Goal status: INITIAL   4.  FOTO score to improve by at least 10 points to show improvement in functional status  Baseline:  Goal status: INITIAL         PLAN: PT FREQUENCY: 2x/week   PT DURATION: 6 weeks   PLANNED INTERVENTIONS: Therapeutic exercises, Therapeutic activity, Neuromuscular re-education, Balance training, Gait training, Patient/Family education, Self Care, Joint mobilization, Orthotic/Fit training, Dry Needling, Electrical stimulation, Spinal mobilization, Cryotherapy, Moist heat, Taping, Ultrasound, Ionotophoresis 66m/ml Dexamethasone, Manual therapy, and Re-evaluation.   PLAN FOR NEXT SESSION: DN as desired.  Progressive exercise improvements, postural  strengthening for standing/work activity.   MScot Jun PT, DPT, OCS, ATC 05/03/22  10:01 AM     PHYSICAL THERAPY DISCHARGE SUMMARY  Visits from Start of Care: 3  Current functional level related to goals / functional outcomes: See above   Remaining deficits: unknown   Education / Equipment: HEP   Patient agrees to discharge. Patient goals were not met. Patient is being discharged due to not returning since the last visit.  SLaureen Abrahams PT, DPT 06/03/22 11:30 AM [facility] [address] [address]Phone: [phone]  Fax:  [phone]

## 2022-05-05 ENCOUNTER — Encounter: Payer: 59 | Admitting: Rehabilitative and Restorative Service Providers"

## 2022-05-10 ENCOUNTER — Telehealth: Payer: Self-pay | Admitting: Rehabilitative and Restorative Service Providers"

## 2022-05-10 ENCOUNTER — Encounter: Payer: 59 | Admitting: Rehabilitative and Restorative Service Providers"

## 2022-05-10 NOTE — Telephone Encounter (Signed)
Called patient after 15 mins no show for appointment today.  Pt indicated he was sick and forgot to call to cancel appointment.  Reminded him of next appointment on Wednesday and instruction to cancel if necessary if still sick.    Chyrel Masson, PT, DPT, OCS, ATC 05/10/22  8:20 AM

## 2022-05-12 ENCOUNTER — Encounter: Payer: 59 | Admitting: Rehabilitative and Restorative Service Providers"

## 2022-05-17 ENCOUNTER — Encounter: Payer: 59 | Admitting: Rehabilitative and Restorative Service Providers"

## 2022-05-19 ENCOUNTER — Encounter: Payer: 59 | Admitting: Rehabilitative and Restorative Service Providers"

## 2022-05-24 ENCOUNTER — Telehealth: Payer: Self-pay | Admitting: Rehabilitative and Restorative Service Providers"

## 2022-05-24 ENCOUNTER — Encounter: Payer: 59 | Admitting: Rehabilitative and Restorative Service Providers"

## 2022-05-24 NOTE — Telephone Encounter (Signed)
Pt indicated that he isn't able to make it on Mondays.  He indicated Tuesday and Wednesdays were better days.  Reminded Pt of last visit scheduled for this wed at 9pm.  Chyrel Masson, PT, DPT, OCS, ATC 05/24/22  8:20 AM

## 2022-05-26 ENCOUNTER — Encounter: Payer: 59 | Admitting: Physical Therapy

## 2022-06-08 ENCOUNTER — Ambulatory Visit: Payer: 59 | Admitting: Physical Medicine and Rehabilitation

## 2022-06-08 ENCOUNTER — Telehealth: Payer: Self-pay | Admitting: Physical Medicine and Rehabilitation

## 2022-06-08 NOTE — Telephone Encounter (Signed)
Pt called to cancel appt. Pt is sick and will call another time to reschedule. No call back needed just cancel

## 2022-06-08 NOTE — Telephone Encounter (Signed)
done

## 2023-01-28 ENCOUNTER — Encounter (HOSPITAL_COMMUNITY): Payer: Self-pay

## 2023-01-28 ENCOUNTER — Emergency Department (HOSPITAL_COMMUNITY): Payer: 59

## 2023-01-28 ENCOUNTER — Other Ambulatory Visit: Payer: Self-pay

## 2023-01-28 ENCOUNTER — Emergency Department (HOSPITAL_COMMUNITY)
Admission: EM | Admit: 2023-01-28 | Discharge: 2023-01-29 | Disposition: A | Discharge status: Home / Self Care | Payer: 59 | Attending: Emergency Medicine | Admitting: Emergency Medicine

## 2023-01-28 DIAGNOSIS — R002 Palpitations: Secondary | ICD-10-CM | POA: Diagnosis not present

## 2023-01-28 DIAGNOSIS — G5621 Lesion of ulnar nerve, right upper limb: Secondary | ICD-10-CM | POA: Insufficient documentation

## 2023-01-28 DIAGNOSIS — R9431 Abnormal electrocardiogram [ECG] [EKG]: Secondary | ICD-10-CM | POA: Diagnosis not present

## 2023-01-28 DIAGNOSIS — M25511 Pain in right shoulder: Secondary | ICD-10-CM | POA: Diagnosis not present

## 2023-01-28 LAB — CBC
HCT: 50 % (ref 39.0–52.0)
Hemoglobin: 16.6 g/dL (ref 13.0–17.0)
MCH: 30.9 pg (ref 26.0–34.0)
MCHC: 33.2 g/dL (ref 30.0–36.0)
MCV: 93.1 fL (ref 80.0–100.0)
Platelets: 232 10*3/uL (ref 150–400)
RBC: 5.37 MIL/uL (ref 4.22–5.81)
RDW: 13.8 % (ref 11.5–15.5)
WBC: 4.9 10*3/uL (ref 4.0–10.5)
nRBC: 0 % (ref 0.0–0.2)

## 2023-01-28 NOTE — ED Triage Notes (Signed)
Pt arrived with complaints of irregular heartbeat and right shoulder pain. Pt stated he is now having numbness in his fingers on his right hand. No c/o chest pain.

## 2023-01-29 LAB — TROPONIN I (HIGH SENSITIVITY)
Troponin I (High Sensitivity): 5 ng/L (ref <18)
Troponin I (High Sensitivity): 6 ng/L (ref <18)

## 2023-01-29 LAB — BASIC METABOLIC PANEL
Anion gap: 8 (ref 5–15)
BUN: 12 mg/dL (ref 6–20)
CO2: 26 mmol/L (ref 22–32)
Calcium: 9.4 mg/dL (ref 8.9–10.3)
Chloride: 105 mmol/L (ref 98–111)
Creatinine, Ser: 0.96 mg/dL (ref 0.61–1.24)
GFR, Estimated: >60 mL/min (ref >60)
Glucose, Bld: 89 mg/dL (ref 70–99)
Potassium: 4 mmol/L (ref 3.5–5.1)
Sodium: 139 mmol/L (ref 135–145)

## 2023-01-29 NOTE — ED Provider Notes (Signed)
[facility] Emergency Department Provider Note MRN:  [id number]  Arrival date & time: 01/29/23     Chief Complaint   Irregular Heart Beat (/), Numbness, and Shoulder Pain   History of Present Illness   Bernard Baker is a 47 y.o. year-old male presents to the ED with chief complaint of irregular heart beat and right shoulder pain.  He reports numbness/tingling that radiates into his right 4th and 5th fingers.  He states that this is not a new problem.   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    He states that his main concern tonight was with the pain, palpitations, and numbness all together, he wanted to ensure he hadn't had a stroke.  History provided by patient.   Review of Systems  Pertinent positive and negative review of systems noted in HPI.    Physical Exam   Vitals:   01/28/23 2251 01/29/23 0100  BP: (!) 145/87 (!) 106/51  Pulse: 81 (!) 56  Resp: 13 14  Temp: 98.8 F (37.1 C) 98.7 F (37.1 C)  SpO2: 100% 99%    CONSTITUTIONAL:  non toxic-appearing, NAD NEURO:  Alert and oriented x 3, CN 3-12 grossly intact EYES:  eyes equal and reactive ENT/NECK:  Supple, no stridor  CARDIO:  normal rate on my exam, regular rhythm, appears well-perfused  PULM:  No respiratory distress,  GI/GU:  non-distended,  MSK/SPINE:  No gross deformities, no edema, moves all extremities  SKIN:  no rash, atraumatic   *Additional and/or pertinent findings included in MDM below  Diagnostic and Interventional Summary    EKG Interpretation  Date/Time:  Friday Jan 28 2023 22:55:30 EDT Ventricular Rate:  86 PR Interval:  142 QRS Duration: 79 QT Interval:  353 QTC Calculation: 423 R Axis:   39 Text Interpretation: Sinus rhythm Ventricular trigeminy Prominent P waves, nondiagnostic Confirmed by Kennis Carina [phone]) on 01/28/2023 11:21:24 PM       Labs Reviewed  BASIC METABOLIC PANEL  CBC   TROPONIN I (HIGH SENSITIVITY)  TROPONIN I (HIGH SENSITIVITY)    DG Chest Port 1 View  Final Result      Medications - No data to display   Procedures  /  Critical Care Procedures  ED Course and Medical Decision Making  I have reviewed the triage vital signs, the nursing notes, and pertinent available records from the EMR.  Social Determinants Affecting Complexity of Care: Patient has no clinically significant social determinants affecting this chief complaint..   ED Course:    Medical Decision Making Patient here with palpitations.  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  He also complains of right shoulder pain and numbness in his right fourth and fifth.  His symptoms are most consistent with  Cervical or ulnar radiculopathy.  Will refer him to orthopedics for this.  He does not have any weakness.  Regarding his PVCs, he has been seen by cardiology for this and was told it was benign and didn't need any medications. Will refer back to cards.   Amount and/or Complexity of Data Reviewed Labs: ordered.    Details: Trops negative x 2 No electrolyte abnormalities No leukocytosis or anemia Radiology: independent interpretation performed.    Details: No opacities ECG/medicine tests: independent interpretation performed.    Details: Pvcs seen     Consultants: No consultations were needed in caring for this patient.   Treatment and  Plan: I considered admission due to patient's initial presentation, but after considering the examination and diagnostic results, patient will not require admission and can be discharged with outpatient follow-up.    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 Right shoulder pain, unspecified chronicity  M25.511     3. Palpitations  R00.2       ED Discharge Orders     None         Discharge Instructions Discussed with and Provided to Patient:      Discharge Instructions      Your symptoms in the arm are consistent with nerve injury.  Please follow-up with the orthopedic doctor.  Please follow-up with your cardiologist about your heart rhythm.   If symptoms change or worsen, please return to the ER.       Roxy Horseman, PA-C 01/29/23 0249    Sabas Sous, MD 01/29/23 [phone]

## 2023-01-29 NOTE — Discharge Instructions (Signed)
Your symptoms in the arm are consistent with nerve injury.  Please follow-up with the orthopedic doctor.  Please follow-up with your cardiologist about your heart rhythm.   If symptoms change or worsen, please return to the ER.

## 2023-02-04 DIAGNOSIS — M7541 Impingement syndrome of right shoulder: Secondary | ICD-10-CM | POA: Diagnosis not present

## 2023-02-15 DIAGNOSIS — M6281 Muscle weakness (generalized): Secondary | ICD-10-CM | POA: Diagnosis not present

## 2023-02-15 DIAGNOSIS — M25511 Pain in right shoulder: Secondary | ICD-10-CM | POA: Diagnosis not present

## 2023-02-15 DIAGNOSIS — M7541 Impingement syndrome of right shoulder: Secondary | ICD-10-CM | POA: Diagnosis not present

## 2023-02-23 DIAGNOSIS — M7541 Impingement syndrome of right shoulder: Secondary | ICD-10-CM | POA: Diagnosis not present

## 2023-02-23 DIAGNOSIS — M6281 Muscle weakness (generalized): Secondary | ICD-10-CM | POA: Diagnosis not present

## 2023-02-23 DIAGNOSIS — M25511 Pain in right shoulder: Secondary | ICD-10-CM | POA: Diagnosis not present

## 2023-03-01 DIAGNOSIS — M6281 Muscle weakness (generalized): Secondary | ICD-10-CM | POA: Diagnosis not present

## 2023-03-01 DIAGNOSIS — M7541 Impingement syndrome of right shoulder: Secondary | ICD-10-CM | POA: Diagnosis not present

## 2023-03-01 DIAGNOSIS — M25511 Pain in right shoulder: Secondary | ICD-10-CM | POA: Diagnosis not present

## 2023-03-02 DIAGNOSIS — M25511 Pain in right shoulder: Secondary | ICD-10-CM | POA: Diagnosis not present

## 2023-03-09 ENCOUNTER — Ambulatory Visit (INDEPENDENT_AMBULATORY_CARE_PROVIDER_SITE_OTHER): Payer: 59 | Admitting: Emergency Medicine

## 2023-03-09 ENCOUNTER — Encounter: Payer: Self-pay | Admitting: Emergency Medicine

## 2023-03-09 VITALS — BP 120/84 | HR 67 | Temp 98.6°F | Ht 67.05 in | Wt 180.4 lb

## 2023-03-09 DIAGNOSIS — F17200 Nicotine dependence, unspecified, uncomplicated: Secondary | ICD-10-CM | POA: Insufficient documentation

## 2023-03-09 DIAGNOSIS — Z8639 Personal history of other endocrine, nutritional and metabolic disease: Secondary | ICD-10-CM | POA: Insufficient documentation

## 2023-03-09 DIAGNOSIS — Z7689 Persons encountering health services in other specified circumstances: Secondary | ICD-10-CM

## 2023-03-09 DIAGNOSIS — I10 Essential (primary) hypertension: Secondary | ICD-10-CM

## 2023-03-09 LAB — COMPREHENSIVE METABOLIC PANEL
ALT: 15 U/L (ref 0–53)
AST: 15 U/L (ref 0–37)
Albumin: 4.2 g/dL (ref 3.5–5.2)
Alkaline Phosphatase: 47 U/L (ref 39–117)
BUN: 10 mg/dL (ref 6–23)
CO2: 26 mEq/L (ref 19–32)
Calcium: 9.5 mg/dL (ref 8.4–10.5)
Chloride: 102 mEq/L (ref 96–112)
Creatinine, Ser: 0.9 mg/dL (ref 0.40–1.50)
GFR: 103.63 mL/min (ref >60.00)
Glucose, Bld: 65 mg/dL — ABNORMAL LOW (ref 70–99)
Potassium: 3.8 mEq/L (ref 3.5–5.1)
Sodium: 136 mEq/L (ref 135–145)
Total Bilirubin: 0.8 mg/dL (ref 0.2–1.2)
Total Protein: 7.7 g/dL (ref 6.0–8.3)

## 2023-03-09 LAB — CBC WITH DIFFERENTIAL/PLATELET
Basophils Absolute: 0 10*3/uL (ref 0.0–0.1)
Basophils Relative: 1.2 % (ref 0.0–3.0)
Eosinophils Absolute: 0.1 10*3/uL (ref 0.0–0.7)
Eosinophils Relative: 4 % (ref 0.0–5.0)
HCT: 48.3 % (ref 39.0–52.0)
Hemoglobin: 15.7 g/dL (ref 13.0–17.0)
Lymphocytes Relative: 43.9 % (ref 12.0–46.0)
Lymphs Abs: 1.5 10*3/uL (ref 0.7–4.0)
MCHC: 32.6 g/dL (ref 30.0–36.0)
MCV: 92.6 fl (ref 78.0–100.0)
Monocytes Absolute: 0.3 10*3/uL (ref 0.1–1.0)
Monocytes Relative: 9.2 % (ref 3.0–12.0)
Neutro Abs: 1.5 10*3/uL (ref 1.4–7.7)
Neutrophils Relative %: 41.7 % — ABNORMAL LOW (ref 43.0–77.0)
Platelets: 244 10*3/uL (ref 150.0–400.0)
RBC: 5.21 Mil/uL (ref 4.22–5.81)
RDW: 13.5 % (ref 11.5–15.5)
WBC: 3.5 10*3/uL — ABNORMAL LOW (ref 4.0–10.5)

## 2023-03-09 LAB — LIPID PANEL
Cholesterol: 152 mg/dL (ref 0–200)
HDL: 44.9 mg/dL (ref >39.00)
LDL Cholesterol: 87 mg/dL (ref 0–99)
NonHDL: 106.93
Total CHOL/HDL Ratio: 3
Triglycerides: 98 mg/dL (ref 0.0–149.0)
VLDL: 19.6 mg/dL (ref 0.0–40.0)

## 2023-03-09 LAB — POCT GLYCOSYLATED HEMOGLOBIN (HGB A1C): Hemoglobin A1C: 5 % (ref 4.0–5.6)

## 2023-03-09 MED ORDER — LISINOPRIL 20 MG PO TABS: 20.0000 mg | ORAL_TABLET | Freq: Every day | ORAL | 3 refills | Status: DC

## 2023-03-09 NOTE — Assessment & Plan Note (Signed)
Cardiovascular and cancer risks associated with smoking discussed. °Smoking cessation advice given. °

## 2023-03-09 NOTE — Patient Instructions (Signed)
Hypertension, Adult High blood pressure (hypertension) is when the force of blood pumping through the arteries is too strong. The arteries are the blood vessels that carry blood from the heart throughout the body. Hypertension forces the heart to work harder to pump blood and may cause arteries to become narrow or stiff. Untreated or uncontrolled hypertension can lead to a heart attack, heart failure, a stroke, kidney disease, and other problems. A blood pressure reading consists of a higher number over a lower number. Ideally, your blood pressure should be below 120/80. The first ("top") number is called the systolic pressure. It is a measure of the pressure in your arteries as your heart beats. The second ("bottom") number is called the diastolic pressure. It is a measure of the pressure in your arteries as the heart relaxes. What are the causes? The exact cause of this condition is not known. There are some conditions that result in high blood pressure. What increases the risk? Certain factors may make you more likely to develop high blood pressure. Some of these risk factors are under your control, including: Smoking. Not getting enough exercise or physical activity. Being overweight. Having too much fat, sugar, calories, or salt (sodium) in your diet. Drinking too much alcohol. Other risk factors include: Having a personal history of heart disease, diabetes, high cholesterol, or kidney disease. Stress. Having a family history of high blood pressure and high cholesterol. Having obstructive sleep apnea. Age. The risk increases with age. What are the signs or symptoms? High blood pressure may not cause symptoms. Very high blood pressure (hypertensive crisis) may cause: Headache. Fast or irregular heartbeats (palpitations). Shortness of breath. Nosebleed. Nausea and vomiting. Vision changes. Severe chest pain, dizziness, and seizures. How is this diagnosed? This condition is diagnosed by  measuring your blood pressure while you are seated, with your arm resting on a flat surface, your legs uncrossed, and your feet flat on the floor. The cuff of the blood pressure monitor will be placed directly against the skin of your upper arm at the level of your heart. Blood pressure should be measured at least twice using the same arm. Certain conditions can cause a difference in blood pressure between your right and left arms. If you have a high blood pressure reading during one visit or you have normal blood pressure with other risk factors, you may be asked to: Return on a different day to have your blood pressure checked again. Monitor your blood pressure at home for 1 week or longer. If you are diagnosed with hypertension, you may have other blood or imaging tests to help your health care provider understand your overall risk for other conditions. How is this treated? This condition is treated by making healthy lifestyle changes, such as eating healthy foods, exercising more, and reducing your alcohol intake. You may be referred for counseling on a healthy diet and physical activity. Your health care provider may prescribe medicine if lifestyle changes are not enough to get your blood pressure under control and if: Your systolic blood pressure is above 130. Your diastolic blood pressure is above 80. Your personal target blood pressure may vary depending on your medical conditions, your age, and other factors. Follow these instructions at home: Eating and drinking  Eat a diet that is high in fiber and potassium, and low in sodium, added sugar, and fat. An example of this eating plan is called the DASH diet. DASH stands for Dietary Approaches to Stop Hypertension. To eat this way: Eat   plenty of fresh fruits and vegetables. Try to fill one half of your plate at each meal with fruits and vegetables. Eat whole grains, such as whole-wheat pasta, brown rice, or whole-grain bread. Fill about one  fourth of your plate with whole grains. Eat or drink low-fat dairy products, such as skim milk or low-fat yogurt. Avoid fatty cuts of meat, processed or cured meats, and poultry with skin. Fill about one fourth of your plate with lean proteins, such as fish, chicken without skin, beans, eggs, or tofu. Avoid pre-made and processed foods. These tend to be higher in sodium, added sugar, and fat. Reduce your daily sodium intake. Many people with hypertension should eat less than 1,500 mg of sodium a day. Do not drink alcohol if: Your health care provider tells you not to drink. You are pregnant, may be pregnant, or are planning to become pregnant. If you drink alcohol: Limit how much you have to: 0-1 drink a day for women. 0-2 drinks a day for men. Know how much alcohol is in your drink. In the [place] one drink equals one 12 oz bottle of beer (355 mL), one 5 oz glass of wine (148 mL), or one 1 oz glass of hard liquor (44 mL). Lifestyle  Work with your health care provider to maintain a healthy body weight or to lose weight. Ask what an ideal weight is for you. Get at least 30 minutes of exercise that causes your heart to beat faster (aerobic exercise) most days of the week. Activities may include walking, swimming, or biking. Include exercise to strengthen your muscles (resistance exercise), such as Pilates or lifting weights, as part of your weekly exercise routine. Try to do these types of exercises for 30 minutes at least 3 days a week. Do not use any products that contain nicotine or tobacco. These products include cigarettes, chewing tobacco, and vaping devices, such as e-cigarettes. If you need help quitting, ask your health care provider. Monitor your blood pressure at home as told by your health care provider. Keep all follow-up visits. This is important. Medicines Take over-the-counter and prescription medicines only as told by your health care provider. Follow directions carefully. Blood  pressure medicines must be taken as prescribed. Do not skip doses of blood pressure medicine. Doing this puts you at risk for problems and can make the medicine less effective. Ask your health care provider about side effects or reactions to medicines that you should watch for. Contact a health care provider if you: Think you are having a reaction to a medicine you are taking. Have headaches that keep coming back (recurring). Feel dizzy. Have swelling in your ankles. Have trouble with your vision. Get help right away if you: Develop a severe headache or confusion. Have unusual weakness or numbness. Feel faint. Have severe pain in your chest or abdomen. Vomit repeatedly. Have trouble breathing. These symptoms may be an emergency. Get help right away. Call 911. Do not wait to see if the symptoms will go away. Do not drive yourself to the hospital. Summary Hypertension is when the force of blood pumping through your arteries is too strong. If this condition is not controlled, it may put you at risk for serious complications. Your personal target blood pressure may vary depending on your medical conditions, your age, and other factors. For most people, a normal blood pressure is less than 120/80. Hypertension is treated with lifestyle changes, medicines, or a combination of both. Lifestyle changes include losing weight, eating a healthy,   low-sodium diet, exercising more, and limiting alcohol. This information is not intended to replace advice given to you by your health care provider. Make sure you discuss any questions you have with your health care provider. Document Revised: 07/21/2021 Document Reviewed: 07/21/2021 [employer] Patient Education  2024 [employer]  

## 2023-03-09 NOTE — Assessment & Plan Note (Signed)
Presently on no medications. Hemoglobin A1c today 5.0 Diet controlled

## 2023-03-09 NOTE — Progress Notes (Signed)
Bernard Baker 47 y.o.   Chief Complaint  Patient presents with   New Patient (Initial Visit)    Patient states he has been having dizzy spells off and on     HISTORY OF PRESENT ILLNESS: This is a 47 y.o. male first visit to this office, here to establish care with me Past medical history of diabetes, diet controlled History of hypertension on lisinopril Chronic smoker Complaining of intermittent episodes of dizziness No other complaints or medical concerns today.  HPI   Prior to Admission medications   Medication Sig Start Date End Date Taking? Authorizing Provider  lisinopril (PRINIVIL,ZESTRIL) 20 MG tablet Take 20 mg by mouth daily. 12/08/17  Yes [provider]  methocarbamol (ROBAXIN) 500 MG tablet Take 1 tablet (500 mg total) by mouth 3 (three) times daily. Patient taking differently: Take 500 mg by mouth every 8 (eight) hours as needed for muscle spasms. 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   Diabetes mellitus without complication (HCC) 04/28/2021   Hypertension 04/28/2021   Obesity 04/28/2021   H/O bariatric surgery 04/28/2021   Nonsustained ventricular tachycardia (HCC) 04/28/2021   Palpitations 01/10/2018   History of cardiac murmur 01/10/2018   Essential hypertension 01/10/2018   Non-insulin dependent type 2 diabetes mellitus (HCC) 01/10/2018   Morbid obesity (HCC) 01/10/2018   Sleep apnea 01/10/2018    Past Medical History:  Diagnosis Date   Diabetes mellitus without complication (HCC)    Essential hypertension    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   H/O bariatric surgery    History of cardiac murmur 01/10/2018   Hypertension    Hypertriglyceridemia 05/14/2021   Morbid obesity (HCC) 01/10/2018   Non-insulin dependent type 2 diabetes mellitus (HCC) 01/10/2018   Nonsustained  ventricular tachycardia (HCC)    Obesity    Palpitations    Sleep apnea 01/10/2018   Pt has poor sleep, daytime fatigue, HTN, morbid obesity, and a history of snoring-r/o sleep apnea    Past Surgical History:  Procedure Laterality Date   LAPAROSCOPIC GASTRIC SLEEVE RESECTION  06/23/2020    Social History   Socioeconomic History   Marital status: Married    Spouse name: Not on file   Number of children: Not on file   Years of education: Not on file   Highest education level: Not on file  Occupational History   Not on file  Tobacco Use   Smoking status: Some Days    Types: Cigarettes    Last attempt to quit: 2015    Years since quitting: 9.4   Smokeless tobacco: Never  Vaping Use   Vaping Use: Former  Substance and Sexual Activity   Alcohol use: No   Drug use: No   Sexual activity: Not on file  Other Topics Concern   Not on file  Social History Narrative   Not on file   Social Determinants of Health   [employer] Resource Strain: Not on file  Food Insecurity: Not on file  Transportation Needs: Not on file  Physical Activity: Not on file  Stress: Not on file  Social Connections: Not on file  Intimate Partner Violence: Not on file    Family History  Problem Relation Age of Onset   Diabetes Mother    Heart Problems Mother    Stroke Father  Heart Problems Father    Heart Problems Maternal Grandmother    Diabetes Maternal Grandmother    Hyperlipidemia Maternal Grandmother    Hypertension Maternal Grandmother    Alzheimer's disease Maternal Grandfather    Blindness Son    Blindness Daughter    Autism Daughter      Review of Systems  Constitutional: Negative.  Negative for chills and fever.  HENT: Negative.  Negative for congestion and sore throat.   Respiratory: Negative.  Negative for cough and hemoptysis.   Cardiovascular: Negative.  Negative for chest pain and palpitations.  Gastrointestinal:  Negative for nausea and vomiting.  Genitourinary:  Negative.  Negative for dysuria and hematuria.  Skin: Negative.  Negative for rash.  Neurological: Negative.  Negative for dizziness and headaches.  All other systems reviewed and are negative.   Vitals:   03/09/23 1335  BP: 120/84  Pulse: 67  Temp: 98.6 F (37 C)  SpO2: 96%    Physical Exam Vitals reviewed.  Constitutional:      Appearance: Normal appearance.  HENT:     Head: Normocephalic.     Mouth/Throat:     Mouth: Mucous membranes are moist.     Pharynx: Oropharynx is clear.  Eyes:     Extraocular Movements: Extraocular movements intact.     Pupils: Pupils are equal, round, and reactive to light.  Cardiovascular:     Rate and Rhythm: Normal rate and regular rhythm.     Pulses: Normal pulses.     Heart sounds: Normal heart sounds.  Pulmonary:     Effort: Pulmonary effort is normal.     Breath sounds: Normal breath sounds.  Abdominal:     Palpations: Abdomen is soft.     Tenderness: There is no abdominal tenderness.  Musculoskeletal:     Cervical back: No tenderness.  Lymphadenopathy:     Cervical: No cervical adenopathy.  Skin:    General: Skin is warm and dry.     Capillary Refill: Capillary refill takes less than 2 seconds.  Neurological:     Mental Status: He is alert and oriented to person, place, and time.  Psychiatric:        Mood and Affect: Mood normal.        Behavior: Behavior normal.    Results for orders placed or performed in visit on 03/09/23 (from the past 24 hour(s))  POCT HgB A1C     Status: Normal   Collection Time: 03/09/23  3:02 PM  Result Value Ref Range   Hemoglobin A1C 5.0 4.0 - 5.6 %   HbA1c POC (<> result, manual entry)     HbA1c, POC (prediabetic range)     HbA1c, POC (controlled diabetic range)       ASSESSMENT & PLAN: A total of 48 minutes was spent with the patient and counseling/coordination of care regarding preparing for this visit, review of available medical records, review of multiple chronic medical conditions  under management, establishing care with me, review of all medications, review of most recent blood work results including interpretation of today's hemoglobin A1c, education on nutrition, prognosis, documentation, and need for follow-up.  Problem List Items Addressed This Visit       Cardiovascular and Mediastinum   Essential hypertension - Primary    Well-controlled hypertension. Continue lisinopril 20 mg daily. Cardiovascular risk associated with hypertension discussed Dietary approaches to stop hypertension discussed      Relevant Medications   lisinopril (ZESTRIL) 20 MG tablet   Other Relevant Orders  CBC with Differential/Platelet   Comprehensive metabolic panel   Lipid panel     Other   Current smoker    Cardiovascular and cancer risks associated with smoking discussed. Smoking cessation advice given.      History of diabetes mellitus    Presently on no medications. Hemoglobin A1c today 5.0 Diet controlled      Relevant Orders   POCT HgB A1C   Other Visit Diagnoses     Encounter to establish care          Patient Instructions  Hypertension, Adult High blood pressure (hypertension) is when the force of blood pumping through the arteries is too strong. The arteries are the blood vessels that carry blood from the heart throughout the body. Hypertension forces the heart to work harder to pump blood and may cause arteries to become narrow or stiff. Untreated or uncontrolled hypertension can lead to a heart attack, heart failure, a stroke, kidney disease, and other problems. A blood pressure reading consists of a higher number over a lower number. Ideally, your blood pressure should be below 120/80. The first ("top") number is called the systolic pressure. It is a measure of the pressure in your arteries as your heart beats. The second ("bottom") number is called the diastolic pressure. It is a measure of the pressure in your arteries as the heart relaxes. What are the  causes? The exact cause of this condition is not known. There are some conditions that result in high blood pressure. What increases the risk? Certain factors may make you more likely to develop high blood pressure. Some of these risk factors are under your control, including: Smoking. Not getting enough exercise or physical activity. Being overweight. Having too much fat, sugar, calories, or salt (sodium) in your diet. Drinking too much alcohol. Other risk factors include: Having a personal history of heart disease, diabetes, high cholesterol, or kidney disease. Stress. Having a family history of high blood pressure and high cholesterol. Having obstructive sleep apnea. Age. The risk increases with age. What are the signs or symptoms? High blood pressure may not cause symptoms. Very high blood pressure (hypertensive crisis) may cause: Headache. Fast or irregular heartbeats (palpitations). Shortness of breath. Nosebleed. Nausea and vomiting. Vision changes. Severe chest pain, dizziness, and seizures. How is this diagnosed? This condition is diagnosed by measuring your blood pressure while you are seated, with your arm resting on a flat surface, your legs uncrossed, and your feet flat on the floor. The cuff of the blood pressure monitor will be placed directly against the skin of your upper arm at the level of your heart. Blood pressure should be measured at least twice using the same arm. Certain conditions can cause a difference in blood pressure between your right and left arms. If you have a high blood pressure reading during one visit or you have normal blood pressure with other risk factors, you may be asked to: Return on a different day to have your blood pressure checked again. Monitor your blood pressure at home for 1 week or longer. If you are diagnosed with hypertension, you may have other blood or imaging tests to help your health care provider understand your overall risk for  other conditions. How is this treated? This condition is treated by making healthy lifestyle changes, such as eating healthy foods, exercising more, and reducing your alcohol intake. You may be referred for counseling on a healthy diet and physical activity. Your health care provider may prescribe medicine if  lifestyle changes are not enough to get your blood pressure under control and if: Your systolic blood pressure is above 130. Your diastolic blood pressure is above 80. Your personal target blood pressure may vary depending on your medical conditions, your age, and other factors. Follow these instructions at home: Eating and drinking  Eat a diet that is high in fiber and potassium, and low in sodium, added sugar, and fat. An example of this eating plan is called the DASH diet. DASH stands for Dietary Approaches to Stop Hypertension. To eat this way: Eat plenty of fresh fruits and vegetables. Try to fill one half of your plate at each meal with fruits and vegetables. Eat whole grains, such as whole-wheat pasta, brown rice, or whole-grain bread. Fill about one fourth of your plate with whole grains. Eat or drink low-fat dairy products, such as skim milk or low-fat yogurt. Avoid fatty cuts of meat, processed or cured meats, and poultry with skin. Fill about one fourth of your plate with lean proteins, such as fish, chicken without skin, beans, eggs, or tofu. Avoid pre-made and processed foods. These tend to be higher in sodium, added sugar, and fat. Reduce your daily sodium intake. Many people with hypertension should eat less than 1,500 mg of sodium a day. Do not drink alcohol if: Your health care provider tells you not to drink. You are pregnant, may be pregnant, or are planning to become pregnant. If you drink alcohol: Limit how much you have to: 0-1 drink a day for women. 0-2 drinks a day for men. Know how much alcohol is in your drink. In the [place] one drink equals one 12 oz bottle of  beer (355 mL), one 5 oz glass of wine (148 mL), or one 1 oz glass of hard liquor (44 mL). Lifestyle  Work with your health care provider to maintain a healthy body weight or to lose weight. Ask what an ideal weight is for you. Get at least 30 minutes of exercise that causes your heart to beat faster (aerobic exercise) most days of the week. Activities may include walking, swimming, or biking. Include exercise to strengthen your muscles (resistance exercise), such as Pilates or lifting weights, as part of your weekly exercise routine. Try to do these types of exercises for 30 minutes at least 3 days a week. Do not use any products that contain nicotine or tobacco. These products include cigarettes, chewing tobacco, and vaping devices, such as e-cigarettes. If you need help quitting, ask your health care provider. Monitor your blood pressure at home as told by your health care provider. Keep all follow-up visits. This is important. Medicines Take over-the-counter and prescription medicines only as told by your health care provider. Follow directions carefully. Blood pressure medicines must be taken as prescribed. Do not skip doses of blood pressure medicine. Doing this puts you at risk for problems and can make the medicine less effective. Ask your health care provider about side effects or reactions to medicines that you should watch for. Contact a health care provider if you: Think you are having a reaction to a medicine you are taking. Have headaches that keep coming back (recurring). Feel dizzy. Have swelling in your ankles. Have trouble with your vision. Get help right away if you: Develop a severe headache or confusion. Have unusual weakness or numbness. Feel faint. Have severe pain in your chest or abdomen. Vomit repeatedly. Have trouble breathing. These symptoms may be an emergency. Get help right away. Call 911. Do  not wait to see if the symptoms will go away. Do not drive  yourself to the hospital. Summary Hypertension is when the force of blood pumping through your arteries is too strong. If this condition is not controlled, it may put you at risk for serious complications. Your personal target blood pressure may vary depending on your medical conditions, your age, and other factors. For most people, a normal blood pressure is less than 120/80. Hypertension is treated with lifestyle changes, medicines, or a combination of both. Lifestyle changes include losing weight, eating a healthy, low-sodium diet, exercising more, and limiting alcohol. This information is not intended to replace advice given to you by your health care provider. Make sure you discuss any questions you have with your health care provider. Document Revised: 07/21/2021 Document Reviewed: 07/21/2021 Elsevier Patient Education  2024 [employer]     Edwina Barth, MD [facility]

## 2023-03-09 NOTE — Assessment & Plan Note (Signed)
Well-controlled hypertension. Continue lisinopril 20 mg daily. Cardiovascular risk associated with hypertension discussed Dietary approaches to stop hypertension discussed. 

## 2023-03-15 DIAGNOSIS — M6281 Muscle weakness (generalized): Secondary | ICD-10-CM | POA: Diagnosis not present

## 2023-03-15 DIAGNOSIS — M25511 Pain in right shoulder: Secondary | ICD-10-CM | POA: Diagnosis not present

## 2023-03-15 DIAGNOSIS — M7541 Impingement syndrome of right shoulder: Secondary | ICD-10-CM | POA: Diagnosis not present

## 2023-03-22 DIAGNOSIS — M25511 Pain in right shoulder: Secondary | ICD-10-CM | POA: Diagnosis not present

## 2023-03-29 DIAGNOSIS — M6281 Muscle weakness (generalized): Secondary | ICD-10-CM | POA: Diagnosis not present

## 2023-03-29 DIAGNOSIS — M25511 Pain in right shoulder: Secondary | ICD-10-CM | POA: Diagnosis not present

## 2023-03-29 DIAGNOSIS — M7541 Impingement syndrome of right shoulder: Secondary | ICD-10-CM | POA: Diagnosis not present

## 2023-03-30 DIAGNOSIS — M19011 Primary osteoarthritis, right shoulder: Secondary | ICD-10-CM | POA: Diagnosis not present

## 2023-11-02 DIAGNOSIS — J029 Acute pharyngitis, unspecified: Secondary | ICD-10-CM | POA: Diagnosis not present

## 2023-12-07 DIAGNOSIS — R42 Dizziness and giddiness: Secondary | ICD-10-CM | POA: Diagnosis not present

## 2023-12-07 DIAGNOSIS — R5381 Other malaise: Secondary | ICD-10-CM | POA: Diagnosis not present

## 2023-12-07 DIAGNOSIS — R5383 Other fatigue: Secondary | ICD-10-CM | POA: Diagnosis not present

## 2023-12-07 DIAGNOSIS — M791 Myalgia, unspecified site: Secondary | ICD-10-CM | POA: Diagnosis not present

## 2024-01-18 ENCOUNTER — Ambulatory Visit (INDEPENDENT_AMBULATORY_CARE_PROVIDER_SITE_OTHER): Admitting: Emergency Medicine

## 2024-01-18 ENCOUNTER — Encounter: Payer: Self-pay | Admitting: Emergency Medicine

## 2024-01-18 VITALS — BP 128/70 | HR 92 | Temp 98.5°F | Ht 67.0 in | Wt 190.0 lb

## 2024-01-18 DIAGNOSIS — Z9109 Other allergy status, other than to drugs and biological substances: Secondary | ICD-10-CM | POA: Insufficient documentation

## 2024-01-18 DIAGNOSIS — I10 Essential (primary) hypertension: Secondary | ICD-10-CM | POA: Diagnosis not present

## 2024-01-18 DIAGNOSIS — S39012A Strain of muscle, fascia and tendon of lower back, initial encounter: Secondary | ICD-10-CM | POA: Insufficient documentation

## 2024-01-18 DIAGNOSIS — F17200 Nicotine dependence, unspecified, uncomplicated: Secondary | ICD-10-CM

## 2024-01-18 MED ORDER — METHYLPREDNISOLONE ACETATE 40 MG/ML IJ SUSP
40.0000 mg | Freq: Once | INTRAMUSCULAR | Status: AC
  Administered 2024-01-18: 40 mg via INTRAMUSCULAR

## 2024-01-18 MED ORDER — LISINOPRIL 20 MG PO TABS: 20.0000 mg | ORAL_TABLET | Freq: Every day | ORAL | 3 refills | Status: DC

## 2024-01-18 MED ORDER — METHYLPREDNISOLONE 4 MG PO TBPK: ORAL_TABLET | ORAL | 1 refills | Status: DC

## 2024-01-18 MED ORDER — LEVOCETIRIZINE DIHYDROCHLORIDE 5 MG PO TABS: 5.0000 mg | ORAL_TABLET | Freq: Every evening | ORAL | 1 refills | Status: AC

## 2024-01-18 NOTE — Assessment & Plan Note (Signed)
 Cardiovascular and cancer risks associated with smoking discussed. Smoking cessation advice given.

## 2024-01-18 NOTE — Progress Notes (Unsigned)
 Bernard Baker 48 y.o.   Chief Complaint  Patient presents with  . Back Pain    Patient states he's been having back pain for years and had cortisone shot before and it helped a lot and wants to get one today. He also wants an Rx for his allergies     HISTORY OF PRESENT ILLNESS: This is a 48 y.o. male with history of chronic intermittent pains to lumbar area complaining of increased pain that last couple days In the past cortisone shots have helped.  Requesting one. Also struggling with allergies the past several days Also here for follow-up of hypertension and medication refill. No other complaints or medical concerns today.  Back Pain Pertinent negatives include no abdominal pain, chest pain, dysuria, fever or headaches.    Prior to Admission medications   Medication Sig Start Date End Date Taking? Authorizing Provider  lisinopril  (ZESTRIL ) 20 MG tablet Take 1 tablet (20 mg total) by mouth daily. 03/09/23  Yes Sahaana Weitman, Isidro Margo, MD  methocarbamol  (ROBAXIN ) 500 MG tablet Take 1 tablet (500 mg total) by mouth 3 (three) times daily. Patient not taking: Reported on 01/18/2024 03/31/22   Williams, Megan E, NP    Allergies  Allergen Reactions  . Shellfish Allergy Anaphylaxis    Patient Active Problem List   Diagnosis Date Noted  . Current smoker 03/09/2023  . History of diabetes mellitus 03/09/2023  . Hypertension 04/28/2021  . H/O bariatric surgery 04/28/2021  . Palpitations 01/10/2018  . History of cardiac murmur 01/10/2018  . Essential hypertension 01/10/2018  . Sleep apnea 01/10/2018    Past Medical History:  Diagnosis Date  . Diabetes mellitus without complication (HCC)   . Essential hypertension   . 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 H/O bariatric surgery   . History of cardiac murmur 01/10/2018  . Hypertension   . Hypertriglyceridemia 05/14/2021  . Morbid obesity (HCC) 01/10/2018  . Non-insulin dependent type 2 diabetes mellitus (HCC) 01/10/2018  . Nonsustained  ventricular tachycardia (HCC)   . Obesity   . Palpitations   . Sleep apnea 01/10/2018   Pt has poor sleep, daytime fatigue, HTN, morbid obesity, and a history of snoring-r/o sleep apnea    Past Surgical History:  Procedure Laterality Date  . LAPAROSCOPIC GASTRIC SLEEVE RESECTION  06/23/2020    Social History   Socioeconomic History  . Marital status: Married    Spouse name: Not on file  . Number of children: Not on file  . Years of education: Not on file  . Highest education level: Not on file  Occupational History  . Not on file  Tobacco Use  . Smoking status: Some Days    Current packs/day: 0.00    Types: Cigarettes    Last attempt to quit: 2015    Years since quitting: 10.3  . Smokeless tobacco: Never  Vaping Use  . Vaping status: Former  Substance and Sexual Activity  . Alcohol use: No  . Drug use: No  . Sexual activity: Not on file  Other Topics Concern  . Not on file  Social History Narrative  . Not on file   Social Drivers of Health   [place] [employer] Strain: Not on file  Food Insecurity: Not on file  Transportation Needs: Not on file  Physical Activity: Not on file  Stress: Not on file  Social Connections: Not on file  Intimate Partner Violence: Not on file    Family History  Problem Relation Age of Onset  . Diabetes Mother   .  Heart Problems Mother   . Stroke Father   . Heart Problems Father   . Heart Problems Maternal Grandmother   . Diabetes Maternal Grandmother   . Hyperlipidemia Maternal Grandmother   . Hypertension Maternal Grandmother   . Alzheimer's disease Maternal Grandfather   . Blindness Son   . Blindness Daughter   . Autism Daughter      Review of Systems  Constitutional: Negative.  Negative for chills and fever.  HENT: Negative.  Negative for congestion and sore throat.   Respiratory: Negative.  Negative for cough and shortness of breath.   Cardiovascular: Negative.  Negative for chest pain and palpitations.   Gastrointestinal:  Negative for abdominal pain, diarrhea, nausea and vomiting.  Genitourinary: Negative.  Negative for dysuria and hematuria.  Musculoskeletal:  Positive for back pain.  Skin: Negative.  Negative for rash.  Neurological: Negative.  Negative for dizziness and headaches.  All other systems reviewed and are negative.  There were no vitals filed for this visit.  Physical Exam Vitals reviewed.  Constitutional:      Appearance: Normal appearance.  HENT:     Head: Normocephalic.  Eyes:     Extraocular Movements: Extraocular movements intact.  Cardiovascular:     Rate and Rhythm: Normal rate and regular rhythm.     Pulses: Normal pulses.     Heart sounds: Normal heart sounds.  Pulmonary:     Effort: Pulmonary effort is normal.     Breath sounds: Normal breath sounds.  Musculoskeletal:     Lumbar back: Spasms and tenderness present. No bony tenderness. Decreased range of motion. Negative right straight leg raise test and negative left straight leg raise test.     Right lower leg: No edema.     Left lower leg: No edema.  Skin:    General: Skin is warm and dry.     Capillary Refill: Capillary refill takes less than 2 seconds.  Neurological:     General: No focal deficit present.     Mental Status: He is alert and oriented to person, place, and time.  Psychiatric:        Mood and Affect: Mood normal.        Behavior: Behavior normal.    ASSESSMENT & PLAN: A total of 42 minutes was spent with the patient and counseling/coordination of care regarding preparing for this visit, review of most recent office visit notes, review of multiple chronic medical conditions and their management, diagnosis of acute lumbar strain and need for orthopedic evaluation, pain management, review of all medications, review of most recent bloodwork results, review of health maintenance items, education on nutrition, prognosis, documentation, and need for follow up.   Problem List Items  Addressed This Visit       Cardiovascular and Mediastinum   Essential hypertension   BP Readings from Last 3 Encounters:  01/18/24 128/70  03/09/23 120/84  01/29/23 112/62  Well-controlled hypertension Continue lisinopril  20 mg daily Cardiovascular risks associated with hypertension discussed Diet and nutrition discussed      Relevant Medications   lisinopril  (ZESTRIL ) 20 MG tablet     Musculoskeletal and Integument   Acute lumbar myofascial strain - Primary   Clinically stable.  No red flag signs or symptoms.   Mechanical in nature.  Related to activities of daily life. Also has history of chronic lumbar pain Pain management discussed May benefit from corticosteroid injection and oral prednisone for several days Recommend orthopedic evaluation.  May benefit from physical therapy. Referral placed  today.        Relevant Medications   methylPREDNISolone  (MEDROL  DOSEPAK) 4 MG TBPK tablet   Other Relevant Orders   Ambulatory referral to Orthopedic Surgery     Other   Current smoker   Cardiovascular and cancer risks associated with smoking discussed. Smoking cessation advice given.      Environmental allergies   Active and affecting quality of life Recommend Medrol  Dosepak and daily Xyzal       Relevant Medications   levocetirizine (XYZAL ) 5 MG tablet   Patient Instructions  Acute Back Pain, Adult Acute back pain is sudden and usually short-lived. It is often caused by an injury to the muscles and tissues in the back. The injury may result from: A muscle, tendon, or ligament getting overstretched or torn. Ligaments are tissues that connect bones to each other. Lifting something improperly can cause a back strain. Wear and tear (degeneration) of the spinal disks. Spinal disks are circular tissue that provide cushioning between the bones of the spine (vertebrae). Twisting motions, such as while playing sports or doing yard work. A hit to the back. Arthritis. You may  have a physical exam, lab tests, and imaging tests to find the cause of your pain. Acute back pain usually goes away with rest and home care. Follow these instructions at home: Managing pain, stiffness, and swelling Take over-the-counter and prescription medicines only as told by your health care provider. Treatment may include medicines for pain and inflammation that are taken by mouth or applied to the skin, or muscle relaxants. Your health care provider may recommend applying ice during the first 24-48 hours after your pain starts. To do this: Put ice in a plastic bag. Place a towel between your skin and the bag. Leave the ice on for 20 minutes, 2-3 times a day. Remove the ice if your skin turns bright red. This is very important. If you cannot feel pain, heat, or cold, you have a greater risk of damage to the area. If directed, apply heat to the affected area as often as told by your health care provider. Use the heat source that your health care provider recommends, such as a moist heat pack or a heating pad. Place a towel between your skin and the heat source. Leave the heat on for 20-30 minutes. Remove the heat if your skin turns bright red. This is especially important if you are unable to feel pain, heat, or cold. You have a greater risk of getting burned. Activity  Do not stay in bed. Staying in bed for more than 1-2 days can delay your recovery. Sit up and stand up straight. Avoid leaning forward when you sit or hunching over when you stand. If you work at a desk, sit close to it so you do not need to lean over. Keep your chin tucked in. Keep your neck drawn back, and keep your elbows bent at a 90-degree angle (right angle). Sit high and close to the steering wheel when you drive. Add lower back (lumbar) support to your car seat, if needed. Take short walks on even surfaces as soon as you are able. Try to increase the length of time you walk each day. Do not sit, drive, or stand in one  place for more than 30 minutes at a time. Sitting or standing for long periods of time can put stress on your back. Do not drive or use heavy machinery while taking prescription pain medicine. Use proper lifting techniques. When you bend  and lift, use positions that put less stress on your back: [place] your knees. Keep the load close to your body. Avoid twisting. Exercise regularly as told by your health care provider. Exercising helps your back heal faster and helps prevent back injuries by keeping muscles strong and flexible. Work with a physical therapist to make a safe exercise program, as recommended by your health care provider. Do any exercises as told by your physical therapist. Lifestyle Maintain a healthy weight. Extra weight puts stress on your back and makes it difficult to have good posture. Avoid activities or situations that make you feel anxious or stressed. Stress and anxiety increase muscle tension and can make back pain worse. Learn ways to manage anxiety and stress, such as through exercise. General instructions Sleep on a firm mattress in a comfortable position. Try lying on your side with your knees slightly bent. If you lie on your back, put a pillow under your knees. Keep your head and neck in a straight line with your spine (neutral position) when using electronic equipment like smartphones or pads. To do this: Raise your smartphone or pad to look at it instead of bending your head or neck to look down. Put the smartphone or pad at the level of your face while looking at the screen. Follow your treatment plan as told by your health care provider. This may include: Cognitive or behavioral therapy. Acupuncture or massage therapy. Meditation or yoga. Contact a health care provider if: You have pain that is not relieved with rest or medicine. You have increasing pain going down into your legs or buttocks. Your pain does not improve after 2 weeks. You have pain at  night. You lose weight without trying. You have a fever or chills. You develop nausea or vomiting. You develop abdominal pain. Get help right away if: You develop new bowel or bladder control problems. You have unusual weakness or numbness in your arms or legs. You feel faint. These symptoms may represent a serious problem that is an emergency. Do not wait to see if the symptoms will go away. Get medical help right away. Call your local emergency services (911 in the [place] Do not drive yourself to the hospital. Summary Acute back pain is sudden and usually short-lived. Use proper lifting techniques. When you bend and lift, use positions that put less stress on your back. Take over-the-counter and prescription medicines only as told by your health care provider, and apply heat or ice as told. This information is not intended to replace advice given to you by your health care provider. Make sure you discuss any questions you have with your health care provider. Document Revised: 12/05/2020 Document Reviewed: 12/05/2020 Elsevier Patient Education  2024 [employer]     Maryagnes Small, MD [facility]

## 2024-01-18 NOTE — Assessment & Plan Note (Signed)
 Active and affecting quality of life Recommend Medrol  Dosepak and daily Xyzal 

## 2024-01-18 NOTE — Assessment & Plan Note (Signed)
 BP Readings from Last 3 Encounters:  01/18/24 128/70  03/09/23 120/84  01/29/23 112/62  Well-controlled hypertension Continue lisinopril  20 mg daily Cardiovascular risks associated with hypertension discussed Diet and nutrition discussed

## 2024-01-18 NOTE — Patient Instructions (Signed)
 Acute Back Pain, Adult Acute back pain is sudden and usually short-lived. It is often caused by an injury to the muscles and tissues in the back. The injury may result from: A muscle, tendon, or ligament getting overstretched or torn. Ligaments are tissues that connect bones to each other. Lifting something improperly can cause a back strain. Wear and tear (degeneration) of the spinal disks. Spinal disks are circular tissue that provide cushioning between the bones of the spine (vertebrae). Twisting motions, such as while playing sports or doing yard work. A hit to the back. Arthritis. You may have a physical exam, lab tests, and imaging tests to find the cause of your pain. Acute back pain usually goes away with rest and home care. Follow these instructions at home: Managing pain, stiffness, and swelling Take over-the-counter and prescription medicines only as told by your health care provider. Treatment may include medicines for pain and inflammation that are taken by mouth or applied to the skin, or muscle relaxants. Your health care provider may recommend applying ice during the first 24-48 hours after your pain starts. To do this: Put ice in a plastic bag. Place a towel between your skin and the bag. Leave the ice on for 20 minutes, 2-3 times a day. Remove the ice if your skin turns bright red. This is very important. If you cannot feel pain, heat, or cold, you have a greater risk of damage to the area. If directed, apply heat to the affected area as often as told by your health care provider. Use the heat source that your health care provider recommends, such as a moist heat pack or a heating pad. Place a towel between your skin and the heat source. Leave the heat on for 20-30 minutes. Remove the heat if your skin turns bright red. This is especially important if you are unable to feel pain, heat, or cold. You have a greater risk of getting burned. Activity  Do not stay in bed. Staying in  bed for more than 1-2 days can delay your recovery. Sit up and stand up straight. Avoid leaning forward when you sit or hunching over when you stand. If you work at a desk, sit close to it so you do not need to lean over. Keep your chin tucked in. Keep your neck drawn back, and keep your elbows bent at a 90-degree angle (right angle). Sit high and close to the steering wheel when you drive. Add lower back (lumbar) support to your car seat, if needed. Take short walks on even surfaces as soon as you are able. Try to increase the length of time you walk each day. Do not sit, drive, or stand in one place for more than 30 minutes at a time. Sitting or standing for long periods of time can put stress on your back. Do not drive or use heavy machinery while taking prescription pain medicine. Use proper lifting techniques. When you bend and lift, use positions that put less stress on your back: [place] your knees. Keep the load close to your body. Avoid twisting. Exercise regularly as told by your health care provider. Exercising helps your back heal faster and helps prevent back injuries by keeping muscles strong and flexible. Work with a physical therapist to make a safe exercise program, as recommended by your health care provider. Do any exercises as told by your physical therapist. Lifestyle Maintain a healthy weight. Extra weight puts stress on your back and makes it difficult to have good  posture. Avoid activities or situations that make you feel anxious or stressed. Stress and anxiety increase muscle tension and can make back pain worse. Learn ways to manage anxiety and stress, such as through exercise. General instructions Sleep on a firm mattress in a comfortable position. Try lying on your side with your knees slightly bent. If you lie on your back, put a pillow under your knees. Keep your head and neck in a straight line with your spine (neutral position) when using electronic equipment like  smartphones or pads. To do this: Raise your smartphone or pad to look at it instead of bending your head or neck to look down. Put the smartphone or pad at the level of your face while looking at the screen. Follow your treatment plan as told by your health care provider. This may include: Cognitive or behavioral therapy. Acupuncture or massage therapy. Meditation or yoga. Contact a health care provider if: You have pain that is not relieved with rest or medicine. You have increasing pain going down into your legs or buttocks. Your pain does not improve after 2 weeks. You have pain at night. You lose weight without trying. You have a fever or chills. You develop nausea or vomiting. You develop abdominal pain. Get help right away if: You develop new bowel or bladder control problems. You have unusual weakness or numbness in your arms or legs. You feel faint. These symptoms may represent a serious problem that is an emergency. Do not wait to see if the symptoms will go away. Get medical help right away. Call your local emergency services (911 in the [place] Do not drive yourself to the hospital. Summary Acute back pain is sudden and usually short-lived. Use proper lifting techniques. When you bend and lift, use positions that put less stress on your back. Take over-the-counter and prescription medicines only as told by your health care provider, and apply heat or ice as told. This information is not intended to replace advice given to you by your health care provider. Make sure you discuss any questions you have with your health care provider. Document Revised: 12/05/2020 Document Reviewed: 12/05/2020 Elsevier Patient Education  2024 [employer].

## 2024-01-18 NOTE — Assessment & Plan Note (Addendum)
 Clinically stable.  No red flag signs or symptoms.   Mechanical in nature.  Related to activities of daily life. Also has history of chronic lumbar pain Pain management discussed May benefit from corticosteroid injection and oral prednisone for several days Recommend orthopedic evaluation.  May benefit from physical therapy. Referral placed today.

## 2024-02-13 ENCOUNTER — Ambulatory Visit: Admitting: Emergency Medicine

## 2024-03-06 ENCOUNTER — Encounter: Payer: Self-pay | Admitting: Emergency Medicine

## 2024-03-06 ENCOUNTER — Other Ambulatory Visit: Payer: Self-pay | Admitting: Radiology

## 2024-03-06 DIAGNOSIS — R42 Dizziness and giddiness: Secondary | ICD-10-CM

## 2024-03-13 ENCOUNTER — Encounter: Payer: Self-pay | Admitting: Physical Medicine and Rehabilitation

## 2024-03-13 ENCOUNTER — Ambulatory Visit: Admitting: Physical Medicine and Rehabilitation

## 2024-03-13 DIAGNOSIS — M546 Pain in thoracic spine: Secondary | ICD-10-CM

## 2024-03-13 DIAGNOSIS — G8929 Other chronic pain: Secondary | ICD-10-CM

## 2024-03-13 DIAGNOSIS — M7918 Myalgia, other site: Secondary | ICD-10-CM

## 2024-03-13 DIAGNOSIS — M545 Low back pain, unspecified: Secondary | ICD-10-CM | POA: Diagnosis not present

## 2024-03-13 NOTE — Progress Notes (Signed)
 Pain Scale   Average Pain 1 Patient advising he has chronic middle back pain the is not constant. Patient advising his pain comes and goes.        +Driver, -BT, -Dye Allergies.

## 2024-03-13 NOTE — Progress Notes (Signed)
 Bernard Baker - 48 y.o. male MRN [id number]  Date of birth: [birth year]  Office Visit Note: Visit Date: 03/13/2024 PCP: Elvira Hammersmith, MD Referred by: Elvira Hammersmith, *  Subjective: Chief Complaint  Patient presents with   Middle Back - Pain   HPI: Bernard Baker is a 48 y.o. male who comes in today per the request of Dr. Maryagnes Small for evaluation of chronic intermittent left sided thoracic back pain radiating down to left lumbar spine. He was last seen in our office in 2023. Pain ongoing for several years. His pain is typically episodic and does tend to resolve with conservative therapies. Pain increases with movement and activity. He describes pain as sore and aching sensation, currently rates as 1 out of 10 today. Some relief of pain with home exercise regimen, rest and use of medications. He does attend massage therapy weekly with significant relief of pain. History of formal physical therapy/dry needling, unable to tolerate dry needling treatments due to pain.  Patient has attended formal chiropractic treatments in the past at [facility] and reports no relief of pain with these treatments. Patient states he also received some type of steroid muscle injections at his primary care office in the past that was beneficial in alleviating his pain. Patient is currently working as Paediatric nurse. Patient denies focal weakness, numbness and tingling. No recent trauma or falls.   Patients course is complicated by non sustained ventricular tachycardia, PVC's and diabetes mellitus. He does have history of bariatric surgery in 2021.           Review of Systems  Musculoskeletal:  Positive for back pain and myalgias.  Neurological:  Negative for tingling, sensory change, focal weakness and weakness.  All other systems reviewed and are negative.  Otherwise per HPI.  Assessment & Plan: Visit Diagnoses:    ICD-10-CM   1. Chronic left-sided thoracic back pain  M54.6     G89.29     2. Chronic left-sided low back pain without sciatica  M54.50    G89.29     3. Myofascial pain syndrome  M79.18        Plan: Findings:  Chronic intermittent left sided thoracic back pain radiating down to left lumbar spine. Patient has exacerbations of pain that typically resolve with conservative therapies. He continues with massage therapy, home exercise regimen, rest and use of medications. Patients clinical presentation and exam are consistent with myofascial pain syndrome. No myofascial tenderness noted upon palpation of left thoracic/lumbar region today, he does not have much in the way of pain at this time. We discussed treatment plan, I offered to obtain thoracic radiographs, however patient needed to leave to get to work. I encouraged him to follow up with us  as needed. Could look at re-grouping with physical therapy. I also discussed myofascial trigger point injections, however I am concerned injections would be painful for him as he could not tolerate dry needling. He can continue with massage therapy as tolerated. No red flag symptoms noted upon exam today.     Meds & Orders: No orders of the defined types were placed in this encounter.  No orders of the defined types were placed in this encounter.   Follow-up: Return if symptoms worsen or fail to improve.   Procedures: No procedures performed      Clinical History: No specialty comments available.   He reports that he has been smoking cigarettes. He has never used smokeless tobacco. No results for  input(s): HGBA1C, LABURIC in the last 8760 hours.  Objective:  VS:  HT:    WT:   BMI:     BP:   HR: bpm  TEMP: ( )  RESP:  Physical Exam Vitals and nursing note reviewed.  HENT:     Head: Normocephalic and atraumatic.     Right Ear: External ear normal.     Left Ear: External ear normal.     Nose: Nose normal.     Mouth/Throat:     Mouth: Mucous membranes are moist.   Eyes:     Extraocular Movements:  Extraocular movements intact.    Cardiovascular:     Rate and Rhythm: Normal rate.     Pulses: Normal pulses.  Pulmonary:     Effort: Pulmonary effort is normal.  Abdominal:     General: Abdomen is flat. There is no distension.   Musculoskeletal:        General: Normal range of motion.     Cervical back: Normal range of motion.     Comments: Patient rises from seated position to standing without difficulty. Good lumbar range of motion. No pain noted with facet loading. 5/5 strength noted with bilateral hip flexion, knee flexion/extension, ankle dorsiflexion/plantarflexion and EHL. No clonus noted bilaterally. No pain upon palpation of greater trochanters. No pain with internal/external rotation of bilateral hips. Sensation intact bilaterally. Negative slump test bilaterally. Ambulates without aid, gait steady.      Skin:    General: Skin is warm and dry.     Capillary Refill: Capillary refill takes less than 2 seconds.   Neurological:     General: No focal deficit present.     Mental Status: He is alert and oriented to person, place, and time.   Psychiatric:        Mood and Affect: Mood normal.        Behavior: Behavior normal.     Ortho Exam  Imaging: No results found.  Past Medical/Family/Surgical/Social History: Medications & Allergies reviewed per EMR, new medications updated. 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   H/O bariatric surgery    History of cardiac murmur 01/10/2018   Hypertension     Hypertriglyceridemia 05/14/2021   Morbid obesity (HCC) 01/10/2018   Non-insulin dependent type 2 diabetes mellitus (HCC) 01/10/2018   Nonsustained ventricular tachycardia (HCC)    Obesity    Palpitations    Sleep apnea 01/10/2018   Pt has poor sleep, daytime fatigue, HTN, morbid obesity, and a history of snoring-r/o sleep apnea   Family History  Problem Relation Age of Onset   Diabetes Mother    Heart Problems Mother    Stroke Father    Heart Problems Father    Heart Problems Maternal Grandmother    Diabetes Maternal Grandmother    Hyperlipidemia Maternal Grandmother    Hypertension Maternal Grandmother    Alzheimer's disease Maternal Grandfather    Blindness Son    Blindness Daughter    Autism Daughter    Past Surgical History:  Procedure Laterality Date  LAPAROSCOPIC GASTRIC SLEEVE RESECTION  06/23/2020   Social History   Occupational History   Not on file  Tobacco Use   Smoking status: Some Days    Current packs/day: 0.00    Types: Cigarettes    Last attempt to quit: 2015    Years since quitting: 10.4   Smokeless tobacco: Never  Vaping Use   Vaping status: Former  Substance and Sexual Activity   Alcohol use: No   Drug use: No   Sexual activity: Not on file

## 2024-06-20 ENCOUNTER — Institutional Professional Consult (permissible substitution) (INDEPENDENT_AMBULATORY_CARE_PROVIDER_SITE_OTHER): Admitting: Otolaryngology

## 2024-07-30 ENCOUNTER — Encounter: Payer: Self-pay | Admitting: Radiology

## 2024-10-24 ENCOUNTER — Encounter: Payer: Self-pay | Admitting: Emergency Medicine

## 2024-10-24 ENCOUNTER — Ambulatory Visit: Payer: Self-pay | Admitting: Emergency Medicine

## 2024-10-24 VITALS — BP 124/84 | HR 79 | Temp 98.2°F | Ht 67.0 in | Wt 197.0 lb

## 2024-10-24 DIAGNOSIS — F17200 Nicotine dependence, unspecified, uncomplicated: Secondary | ICD-10-CM | POA: Diagnosis not present

## 2024-10-24 DIAGNOSIS — I10 Essential (primary) hypertension: Secondary | ICD-10-CM

## 2024-10-24 DIAGNOSIS — R42 Dizziness and giddiness: Secondary | ICD-10-CM

## 2024-10-24 LAB — CBC WITH DIFFERENTIAL/PLATELET
Basophils Absolute: 0 10*3/uL (ref 0.0–0.1)
Basophils Relative: 1.3 % (ref 0.0–3.0)
Eosinophils Absolute: 0.2 10*3/uL (ref 0.0–0.7)
Eosinophils Relative: 4.1 % (ref 0.0–5.0)
HCT: 48.3 % (ref 39.0–52.0)
Hemoglobin: 16 g/dL (ref 13.0–17.0)
Lymphocytes Relative: 40.4 % (ref 12.0–46.0)
Lymphs Abs: 1.6 10*3/uL (ref 0.7–4.0)
MCHC: 33.2 g/dL (ref 30.0–36.0)
MCV: 92.2 fl (ref 78.0–100.0)
Monocytes Absolute: 0.4 10*3/uL (ref 0.1–1.0)
Monocytes Relative: 10.4 % (ref 3.0–12.0)
Neutro Abs: 1.7 10*3/uL (ref 1.4–7.7)
Neutrophils Relative %: 43.8 % (ref 43.0–77.0)
Platelets: 197 10*3/uL (ref 150.0–400.0)
RBC: 5.24 Mil/uL (ref 4.22–5.81)
RDW: 13.9 % (ref 11.5–15.5)
WBC: 3.9 10*3/uL — ABNORMAL LOW (ref 4.0–10.5)

## 2024-10-24 LAB — HEMOGLOBIN A1C: Hgb A1c MFr Bld: 5 % (ref 4.6–6.5)

## 2024-10-24 LAB — COMPREHENSIVE METABOLIC PANEL WITH GFR
ALT: 26 U/L (ref 3–53)
AST: 18 U/L (ref 5–37)
Albumin: 4.3 g/dL (ref 3.5–5.2)
Alkaline Phosphatase: 52 U/L (ref 39–117)
BUN: 13 mg/dL (ref 6–23)
CO2: 29 meq/L (ref 19–32)
Calcium: 9.7 mg/dL (ref 8.4–10.5)
Chloride: 106 meq/L (ref 96–112)
Creatinine, Ser: 1.02 mg/dL (ref 0.40–1.50)
GFR: 88.17 mL/min
Glucose, Bld: 69 mg/dL — ABNORMAL LOW (ref 70–99)
Potassium: 4.2 meq/L (ref 3.5–5.1)
Sodium: 141 meq/L (ref 135–145)
Total Bilirubin: 0.5 mg/dL (ref 0.2–1.2)
Total Protein: 7.2 g/dL (ref 6.0–8.3)

## 2024-10-24 LAB — LIPID PANEL
Cholesterol: 166 mg/dL (ref 28–200)
HDL: 51.9 mg/dL
LDL Cholesterol: 98 mg/dL (ref 10–99)
NonHDL: 114.27
Total CHOL/HDL Ratio: 3
Triglycerides: 79 mg/dL (ref 10.0–149.0)
VLDL: 15.8 mg/dL (ref 0.0–40.0)

## 2024-10-24 MED ORDER — LISINOPRIL 40 MG PO TABS: 40.0000 mg | ORAL_TABLET | Freq: Every day | ORAL | 3 refills | Status: AC

## 2024-10-24 NOTE — Progress Notes (Signed)
 Bernard Baker 49 y.o.   Chief Complaint  Patient presents with   Dizziness    Pt would like to discuss about his blood pressure and also has been experiencing headaches for about a week. Patient also have been experiencing blurry in the eyes     HISTORY OF PRESENT ILLNESS: This is a 49 y.o. male here for follow-up of hypertension.  Blood pressure readings have been high in the afternoons Also complaining of occasional headaches Occasional dizziness for several months.  Balance is off. Occasional blurry vision Still smoking Does not sleep well. Very stressed out.  Chronic stress. No other complaints or medical concerns today.  HPI   Prior to Admission medications  Medication Sig Start Date End Date Taking? Authorizing Provider  levocetirizine (XYZAL ) 5 MG tablet Take 1 tablet (5 mg total) by mouth every evening. 01/18/24  Yes Crystalyn Delia, Emil Schanz, MD  lisinopril  (ZESTRIL ) 20 MG tablet Take 1 tablet (20 mg total) by mouth daily. 01/18/24  Yes Kymberlie Brazeau, Emil Schanz, MD  methylPREDNISolone  (MEDROL  DOSEPAK) 4 MG TBPK tablet Sig as indicated 01/18/24  Yes Azan Maneri, Emil Schanz, MD  methocarbamol  (ROBAXIN ) 500 MG tablet Take 1 tablet (500 mg total) by mouth 3 (three) times daily. 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   H/O bariatric surgery    History of cardiac murmur 01/10/2018   Hypertension    Hypertriglyceridemia  05/14/2021   Morbid obesity (HCC) 01/10/2018   Non-insulin dependent type 2 diabetes mellitus (HCC) 01/10/2018   Nonsustained ventricular tachycardia (HCC)    Obesity    Palpitations    Sleep apnea 01/10/2018   Pt has poor sleep, daytime fatigue, HTN, morbid obesity, and a history of snoring-r/o sleep apnea    Past Surgical History:  Procedure Laterality Date   LAPAROSCOPIC GASTRIC SLEEVE RESECTION  06/23/2020    Social History   Socioeconomic History   Marital status: Married    Spouse name: Not on file   Number of children: Not on file   Years of education: Not on file   Highest education level: Not on file  Occupational History   Not on file  Tobacco Use   Smoking status: Some Days    Current packs/day: 0.00    Types: Cigarettes    Last attempt to quit: 2015    Years since quitting: 11.0   Smokeless tobacco: Never  Vaping Use   Vaping status: Former  Substance and Sexual Activity   Alcohol use: No   Drug use: No   Sexual activity: Not on file  Other Topics Concern   Not on file  Social History Narrative   Not on file   Social Drivers of Health   Tobacco Use: High Risk (03/13/2024)   Patient History    Smoking Tobacco Use: Some Days    Smokeless Tobacco Use: Never    Passive Exposure: Not on file  Baker Resource Strain: Not on file  Food Insecurity: Not on file  Transportation Needs: Not on file  Physical Activity: Not on file  Stress: Not on file  Social Connections: Not on file  Intimate Partner Violence: Not on file  Depression (PHQ2-9): Low Risk (10/24/2024)   Depression (PHQ2-9)    PHQ-2 Score: 0  Alcohol Screen: Not on file  Housing: Not on file  Utilities: Not on file  Health Literacy: Not on file    Family History  Problem Relation Age of Onset   Diabetes Mother    Heart Problems Mother    Stroke Father    Heart Problems Father    Heart Problems Maternal Grandmother    Diabetes Maternal Grandmother    Hyperlipidemia Maternal  Grandmother    Hypertension Maternal Grandmother    Alzheimer's disease Maternal Grandfather    Blindness Son    Blindness Daughter    Autism Daughter      Review of Systems  Constitutional:  Positive for malaise/fatigue.  HENT: Negative.  Negative for congestion and sore throat.   Eyes:  Positive for blurred vision.  Respiratory: Negative.  Negative for cough and shortness of breath.   Cardiovascular: Negative.  Negative for chest pain and palpitations.  Gastrointestinal:  Negative for abdominal pain, nausea and vomiting.  Genitourinary: Negative.  Negative for dysuria and hematuria.  Skin: Negative.  Negative for rash.  Neurological:  Positive for dizziness and headaches.  All other systems reviewed and are negative.   Vitals:   10/24/24 1405  BP: 124/84  Pulse: 79  Temp: 98.2 F (36.8 C)  SpO2: 98%    Physical Exam Vitals reviewed.  Constitutional:      Appearance: Normal appearance.  HENT:     Head: Normocephalic.     Mouth/Throat:     Mouth: Mucous membranes are moist.     Pharynx: Oropharynx is clear.  Eyes:     Extraocular Movements: Extraocular movements intact.     Pupils: Pupils are equal, round, and reactive to light.  Cardiovascular:     Rate and Rhythm: Normal rate and regular rhythm.     Pulses: Normal pulses.     Heart sounds: Normal heart sounds.  Pulmonary:     Effort: Pulmonary effort is normal.     Breath sounds: Normal breath sounds.  Abdominal:     Palpations: Abdomen is soft.     Tenderness: There is no abdominal tenderness.  Musculoskeletal:     Cervical back: No tenderness.  Lymphadenopathy:     Cervical: No cervical adenopathy.  Skin:    General: Skin is warm and dry.     Capillary Refill: Capillary refill takes less than 2 seconds.  Neurological:     General: No focal deficit present.     Mental Status: He is alert and oriented to person, place, and time.  Psychiatric:        Mood and Affect: Mood normal.        Behavior:  Behavior normal.      ASSESSMENT & PLAN: A total of 44 minutes was spent with the patient and counseling/coordination of care regarding preparing for this visit, review of most recent office visit notes, review of multiple chronic medical conditions and their management, differential diagnosis of dizziness and need for workup, review of all medications, review of most recent bloodwork results, review of health maintenance items, education on nutrition, prognosis, documentation, and need for follow up.   Problem List Items Addressed This Visit       Cardiovascular and  Mediastinum   Essential hypertension   BP Readings from Last 3 Encounters:  10/24/24 124/84  01/18/24 128/70  03/09/23 120/84  High blood pressure readings at home Recommend to increase lisinopril  to 40 mg daily Continue monitoring blood pressure readings at home daily for the next several weeks and contact the office if numbers persistently abnormal Recommend blood work today       Relevant Medications   lisinopril  (ZESTRIL ) 40 MG tablet   Other Relevant Orders   Comprehensive metabolic panel with GFR   CBC with Differential/Platelet   Hemoglobin A1c   Vitamin B12   VITAMIN D  25 Hydroxy (Vit-D Deficiency, Fractures)   TSH   PSA   Lipid panel   Hepatitis C antibody   HIV Antibody (routine testing w rflx)     Other   Current smoker   Cardiovascular and cancer risks associated with smoking discussed. Smoking cessation advice given.      Dizziness - Primary   Chronic condition of over 6 months Clinically stable.  No red flag signs or symptoms Multifactorial including tremendous amount of stress and lack of nonrestorative sleep due to stress Differential diagnosis discussed Recommend blood work today ED precautions given Hypertension needs to be better controlled. Smoking cessation advice given      Relevant Orders   Comprehensive metabolic panel with GFR   CBC with Differential/Platelet    Hemoglobin A1c   Vitamin B12   VITAMIN D  25 Hydroxy (Vit-D Deficiency, Fractures)   TSH   PSA   Lipid panel   Hepatitis C antibody   HIV Antibody (routine testing w rflx)   Patient Instructions  Dizziness Dizziness is a common problem. It makes you feel unsteady or light-headed. You may feel like you're about to faint. Dizziness can lead to getting hurt if you stumble or fall. It's more common to feel dizzy if you're an older adult. Many things can cause you to feel dizzy. These include: Medicines. Dehydration. This is when there's not enough water  in your body. Illness. Follow these instructions at home: Eating and drinking  Drink enough fluid to keep your pee (urine) pale yellow. This helps keep you from getting dehydrated. Try to drink more clear fluids, such as water . Do not drink alcohol. Try to limit how much caffeine you take in. Try to limit how much salt, also called sodium, you take in. Activity Try not to make quick movements. Stand up slowly from sitting in a chair. Steady yourself until you feel okay. In the morning, first sit up on the side of the bed. When you feel okay, hold onto something and slowly stand up. Do this until you know that your balance is okay. If you need to stand in one place for a long time, move your legs often. Tighten and relax the muscles in your legs while you're standing. Do not drive or use machines if you feel dizzy. Avoid bending down if you feel dizzy. Place items in your home so you can reach them without leaning over. Lifestyle Do not smoke, vape, or use products with nicotine or tobacco in them. If you need help quitting, talk with your health care provider. Try to lower your stress level. You can do this by using methods like yoga or meditation. Talk with your provider if you need help. General instructions Watch your dizziness for any changes. Take your medicines only as told by your provider. Talk with your provider if you think  you're dizzy because of a medicine you're  taking. Tell a friend or a family member that you're feeling dizzy. If they spot any changes in your behavior, have them call your provider. Contact a health care provider if: Your dizziness doesn't go away, or you have new symptoms. Your dizziness gets worse. You feel like you may vomit. You have trouble hearing. You have a fever. You have neck pain or a stiff neck. You fall or get hurt. Get help right away if: You vomit each time you eat or drink. You have watery poop and can't eat or drink. You have trouble talking, walking, swallowing, or using your arms, hands, or legs. You feel very weak. You're bleeding. You're not thinking clearly, or you have trouble forming sentences. A friend or family member may spot this. Your vision changes, or you get a very bad headache. These symptoms may be an emergency. Call 911 right away. Do not wait to see if the symptoms will go away. Do not drive yourself to the hospital. This information is not intended to replace advice given to you by your health care provider. Make sure you discuss any questions you have with your health care provider. Document Revised: 06/16/2023 Document Reviewed: 10/28/2022 Elsevier Patient Education  2024 [employer] Inc[employer]    Emil Schaumann, MD [facility]     [1]  Allergies Allergen Reactions   Shellfish Allergy Anaphylaxis

## 2024-10-24 NOTE — Assessment & Plan Note (Addendum)
 Chronic condition of over 6 months Clinically stable.  No red flag signs or symptoms Multifactorial including tremendous amount of stress and lack of nonrestorative sleep due to stress Differential diagnosis discussed Recommend blood work today ED precautions given Hypertension needs to be better controlled. Smoking cessation advice given

## 2024-10-24 NOTE — Patient Instructions (Signed)
 Dizziness Dizziness is a common problem. It makes you feel unsteady or light-headed. You may feel like you're about to faint. Dizziness can lead to getting hurt if you stumble or fall. It's more common to feel dizzy if you're an older adult. Many things can cause you to feel dizzy. These include: Medicines. Dehydration. This is when there's not enough water in your body. Illness. Follow these instructions at home: Eating and drinking  Drink enough fluid to keep your pee (urine) pale yellow. This helps keep you from getting dehydrated. Try to drink more clear fluids, such as water. Do not drink alcohol. Try to limit how much caffeine you take in. Try to limit how much salt, also called sodium, you take in. Activity Try not to make quick movements. Stand up slowly from sitting in a chair. Steady yourself until you feel okay. In the morning, first sit up on the side of the bed. When you feel okay, hold onto something and slowly stand up. Do this until you know that your balance is okay. If you need to stand in one place for a long time, move your legs often. Tighten and relax the muscles in your legs while you're standing. Do not drive or use machines if you feel dizzy. Avoid bending down if you feel dizzy. Place items in your home so you can reach them without leaning over. Lifestyle Do not smoke, vape, or use products with nicotine or tobacco in them. If you need help quitting, talk with your health care provider. Try to lower your stress level. You can do this by using methods like yoga or meditation. Talk with your provider if you need help. General instructions Watch your dizziness for any changes. Take your medicines only as told by your provider. Talk with your provider if you think you're dizzy because of a medicine you're taking. Tell a friend or a family member that you're feeling dizzy. If they spot any changes in your behavior, have them call your provider. Contact a health care  provider if: Your dizziness doesn't go away, or you have new symptoms. Your dizziness gets worse. You feel like you may vomit. You have trouble hearing. You have a fever. You have neck pain or a stiff neck. You fall or get hurt. Get help right away if: You vomit each time you eat or drink. You have watery poop and can't eat or drink. You have trouble talking, walking, swallowing, or using your arms, hands, or legs. You feel very weak. You're bleeding. You're not thinking clearly, or you have trouble forming sentences. A friend or family member may spot this. Your vision changes, or you get a very bad headache. These symptoms may be an emergency. Call 911 right away. Do not wait to see if the symptoms will go away. Do not drive yourself to the hospital. This information is not intended to replace advice given to you by your health care provider. Make sure you discuss any questions you have with your health care provider. Document Revised: 06/16/2023 Document Reviewed: 10/28/2022 Elsevier Patient Education  2024 [employer].

## 2024-10-24 NOTE — Assessment & Plan Note (Signed)
 Cardiovascular and cancer risks associated with smoking discussed. Smoking cessation advice given.

## 2024-10-24 NOTE — Assessment & Plan Note (Signed)
 BP Readings from Last 3 Encounters:  10/24/24 124/84  01/18/24 128/70  03/09/23 120/84  High blood pressure readings at home Recommend to increase lisinopril  to 40 mg daily Continue monitoring blood pressure readings at home daily for the next several weeks and contact the office if numbers persistently abnormal Recommend blood work today

## 2024-10-25 ENCOUNTER — Ambulatory Visit: Payer: Self-pay | Admitting: Emergency Medicine

## 2024-10-25 DIAGNOSIS — E559 Vitamin D deficiency, unspecified: Secondary | ICD-10-CM | POA: Insufficient documentation

## 2024-10-25 LAB — VITAMIN B12: Vitamin B-12: 317 pg/mL (ref 211–911)

## 2024-10-25 LAB — HIV ANTIBODY (ROUTINE TESTING W REFLEX)
HIV 1&2 Ab, 4th Generation: NONREACTIVE
HIV FINAL INTERPRETATION: NEGATIVE

## 2024-10-25 LAB — VITAMIN D 25 HYDROXY (VIT D DEFICIENCY, FRACTURES): VITD: 11.02 ng/mL — ABNORMAL LOW (ref 30.00–100.00)

## 2024-10-25 LAB — TSH: TSH: 1.2 u[IU]/mL (ref 0.35–5.50)

## 2024-10-25 LAB — HEPATITIS C ANTIBODY: Hepatitis C Ab: NONREACTIVE

## 2024-10-25 LAB — PSA: PSA: 0.37 ng/mL (ref 0.10–4.00)

## 2024-10-25 MED ORDER — VITAMIN D (ERGOCALCIFEROL) 1.25 MG (50000 UNIT) PO CAPS: 50000.0000 [IU] | ORAL_CAPSULE | ORAL | 1 refills | Status: AC
